# Patient Record
Sex: Female | Born: 1990 | Race: White | Hispanic: No | Marital: Single | Smoking: Current every day smoker
Health system: Southern US, Community
[De-identification: ages and names within clinical notes are randomized; demographics above are authoritative.]

## PROBLEM LIST (undated history)

## (undated) ENCOUNTER — Inpatient Hospital Stay: Payer: Self-pay

## (undated) DIAGNOSIS — F419 Anxiety disorder, unspecified: Secondary | ICD-10-CM

## (undated) DIAGNOSIS — O0930 Supervision of pregnancy with insufficient antenatal care, unspecified trimester: Secondary | ICD-10-CM

---
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
## 2011-02-10 ENCOUNTER — Emergency Department: Payer: Self-pay | Admitting: Emergency Medicine

## 2012-07-09 ENCOUNTER — Emergency Department: Payer: Self-pay | Admitting: Emergency Medicine

## 2013-09-24 ENCOUNTER — Inpatient Hospital Stay: Payer: Self-pay

## 2013-09-24 LAB — URINALYSIS, COMPLETE
Glucose,UR: NEGATIVE mg/dL (ref 0–75)
Nitrite: NEGATIVE
Ph: 6 (ref 4.5–8.0)
Protein: 30
Squamous Epithelial: 11

## 2013-09-24 LAB — CBC WITH DIFFERENTIAL/PLATELET
Basophil #: 0.1 10*3/uL (ref 0.0–0.1)
Basophil %: 0.3 %
Eosinophil #: 0 10*3/uL (ref 0.0–0.7)
HCT: 36.5 % (ref 35.0–47.0)
Lymphocyte %: 5.7 %
MCH: 28.3 pg (ref 26.0–34.0)
MCHC: 33.7 g/dL (ref 32.0–36.0)
Neutrophil #: 21.4 10*3/uL — ABNORMAL HIGH (ref 1.4–6.5)
RBC: 4.35 10*6/uL (ref 3.80–5.20)
RDW: 14.5 % (ref 11.5–14.5)
WBC: 24 10*3/uL — ABNORMAL HIGH (ref 3.6–11.0)

## 2013-09-24 LAB — GLUCOSE, RANDOM: Glucose: 85 mg/dL (ref 65–99)

## 2013-09-24 LAB — RAPID HIV-1/2 QL/CONFIRM: HIV-1/2,Rapid Ql: NEGATIVE

## 2013-09-25 LAB — DRUG SCREEN, URINE
Amphetamines, Ur Screen: NEGATIVE (ref ?–1000)
Benzodiazepine, Ur Scrn: NEGATIVE (ref ?–200)
Cannabinoid 50 Ng, Ur ~~LOC~~: NEGATIVE (ref ?–50)
Cocaine Metabolite,Ur ~~LOC~~: NEGATIVE (ref ?–300)
MDMA (Ecstasy)Ur Screen: NEGATIVE (ref ?–500)
Methadone, Ur Screen: NEGATIVE (ref ?–300)

## 2013-09-25 LAB — CBC WITH DIFFERENTIAL/PLATELET
Eosinophil #: 0.1 10*3/uL (ref 0.0–0.7)
Lymphocyte %: 6.6 %
MCH: 28.3 pg (ref 26.0–34.0)
MCV: 85 fL (ref 80–100)
Monocyte #: 1.1 x10 3/mm — ABNORMAL HIGH (ref 0.2–0.9)
Neutrophil %: 87.3 %
RDW: 14.8 % — ABNORMAL HIGH (ref 11.5–14.5)

## 2013-09-26 LAB — HEMATOCRIT: HCT: 27.8 % — ABNORMAL LOW (ref 35.0–47.0)

## 2013-09-28 LAB — GC/CHLAMYDIA PROBE AMP

## 2015-02-21 NOTE — Op Note (Signed)
PATIENT NAME:  Roberta Bishop, Roberta Bishop MR#:  [id number] DATE OF BIRTH:  [birth year]  DATE OF PROCEDURE:  09/25/2013  PREOPERATIVE DIAGNOSES: 1.  Intrauterine pregnancy at estimated [redacted] weeks gestational age.  2.  Labor.  3.  Arrest of descent.   POSTOPERATIVE DIAGNOSES:  1.  Intrauterine pregnancy at estimated [redacted] weeks gestational age.  2.  Labor.  3.  Arrest of descent.   PROCEDURE: Primary low transverse cesarean section via Pfannenstiel incision.   ANESTHESIA: Spinal.   SURGEON: Conard Novak, MD  ANESTHESIA: Spinal.   ESTIMATED BLOOD LOSS: 800 mL   OPERATIVE FLUIDS: 1200 mL crystalloid.   COMPLICATIONS: None.   FINDINGS: 1.  Viable female infant with a weight of 3100 grams and Apgars 8 at one minute and 9 at five minutes.  2.  Normal-appearing gravid uterus, fallopian tubes and ovaries.   SPECIMENS: None.   CONDITION AT THE END OF PROCEDURE: Stable.   PROCEDURE IN DETAIL: The patient was taken to the operating room where spinal anesthesia was administered and found to be adequate. She was placed in the dorsal supine position with a leftward tilt and prepped and draped in the usual sterile fashion. After timeout was called, a Pfannenstiel incision was made with a scalpel and carried through the various layers, until the peritoneum was identified and entered sharply. After extending the peritoneal opening a bladder flap was created and a bladder retractor placed to pull the bladder out of the operative area of interest. A low transverse hysterotomy was made with a scalpel and extended laterally with cranial and caudal tension. The fetal vertex was grasped after a push up from below and elevated to the hysterotomy and the head followed by the shoulders and the rest of the body were delivered without difficulty. The cord was clamped and cut and the infant was handed to the pediatrician. Cord blood was collected. The placenta was removed spontaneously intact with three vessel cord. The  uterus was exteriorized and cleared of all clots and debris. The hysterotomy was closed using #0 Vicryl in a running locked fashion. A second layer was used to obtain hemostasis and this was an imbricating layer. The uterus was returned to the abdomen and the abdomen was cleared of all clots and debris. The peritoneum was closed using #0 Vicryl in a running fashion.   The On-Q catheter system was placed according to the manufacturer's recommendations. They were placed approximately 4 cm cephalad to the incision line, approximately 1 cm apart straddling the midline. They were inserted to a depth of approximately the fourth mark on catheter, positioned just superficial to the rectus abdominis muscles and just deep to the rectus fascia.   The fascia was closed using #0 Maxon using 2 sutures, each starting at the apices and meeting in the midline where they were tied together. Skin was closed using a subcuticular stapler. This incisional closure was reinforced using benzoin and Steri-Strips. The On-Q catheters were affixed to the skin using Dermabond, Steri-Strips and Tegaderm. Each catheter was bolused with 5 mL of 0.5% Marcaine plain for a total of 10 mL.   The patient tolerated the procedure well. Sponge, lap, and needle counts were correct x 2. For VTE prophylaxis, the patient was wearing pneumatic compression stockings throughout the procedure. For antibiotic prophylaxis, the patient was given Ancef 2 grams prior to skin incision. She was taken to the recovery area in stable condition.    ____________________________ Conard Novak, MD sdj:cc D: 09/25/2013 17:50:34 ET T: 09/25/2013  18:03:19 ET JOB#: [id number]  cc: Conard NovakStephen D. Thelmer Legler, MD, <Dictator> Conard NovakSTEPHEN D Jaziah Kwasnik MD ELECTRONICALLY SIGNED 09/25/2013 18:47

## 2015-03-11 NOTE — H&P (Signed)
L&D Evaluation:  History Expanded:  HPI 24 yo G1 P0 presented to ED today with low abdominal pain. Pt believed LMP was in March, but she did not know that she was pregnant. She reports possibly feeling some movement in the past few months. Pt's mother states FOB incarcerated.   Maternal Varicella Unknown   Rubella Results (Maternal) unknown   Maternal T-Dap Unknown   Patient's Medical History No Chronic Illness   Patient's Surgical History none   Medications none   Allergies NKDA, Pineapple   Social History tobacco   Exam:  Vital Signs initial BP elevated   General appears uncomfortable   Mental Status clear   Chest clear   Heart no murmur/gallop/rubs   Abdomen gravid, tender with contractions   Estimated Fetal Weight Fundal Height 33 cm   Fetal Position vertex   Pelvic no external lesions, 6/100/-1   Mebranes Intact   FHT normal rate with no decels   Ucx regular   Other Pt very tense, unable to do internal speculum exam   Impression:  Impression IUP of unknown gestation, no PNC, labor   Plan:  Plan antibiotics for GBBS prophylaxis   Comments Admission with all OB labs Social Work consult Dating US at bedside  Addendum: Per US report, EGA 36-38 weeks, cephalic, subjectively low amniotic fluid   Electronic Signatures: Vella KohlerBrothers, Lema Heinkel K (CNM)  (Signed 24-Nov-14 23:19)  Authored: L&D Evaluation   Last Updated: 24-Nov-14 23:19 by Vella KohlerBrothers, Jerrine Urschel K (CNM)

## 2016-02-11 ENCOUNTER — Emergency Department: Payer: Self-pay

## 2016-02-11 ENCOUNTER — Emergency Department
Admission: EM | Admit: 2016-02-11 | Discharge: 2016-02-11 | Disposition: A | Discharge status: Home / Self Care | Payer: Self-pay | Attending: Emergency Medicine | Admitting: Emergency Medicine

## 2016-02-11 ENCOUNTER — Encounter: Payer: Self-pay | Admitting: Emergency Medicine

## 2016-02-11 DIAGNOSIS — O2342 Unspecified infection of urinary tract in pregnancy, second trimester: Secondary | ICD-10-CM | POA: Insufficient documentation

## 2016-02-11 DIAGNOSIS — O26899 Other specified pregnancy related conditions, unspecified trimester: Secondary | ICD-10-CM

## 2016-02-11 DIAGNOSIS — R102 Pelvic and perineal pain: Secondary | ICD-10-CM

## 2016-02-11 DIAGNOSIS — N390 Urinary tract infection, site not specified: Secondary | ICD-10-CM

## 2016-02-11 DIAGNOSIS — F17210 Nicotine dependence, cigarettes, uncomplicated: Secondary | ICD-10-CM | POA: Insufficient documentation

## 2016-02-11 LAB — URINALYSIS COMPLETE WITH MICROSCOPIC (ARMC ONLY)
BILIRUBIN URINE: NEGATIVE
Glucose, UA: NEGATIVE mg/dL
Ketones, ur: NEGATIVE mg/dL
Nitrite: POSITIVE — AB
PH: 5 (ref 5.0–8.0)
PROTEIN: NEGATIVE mg/dL
Specific Gravity, Urine: 1.026 (ref 1.005–1.030)

## 2016-02-11 LAB — CBC
HEMATOCRIT: 37.2 % (ref 35.0–47.0)
Hemoglobin: 12.9 g/dL (ref 12.0–16.0)
MCH: 30.8 pg (ref 26.0–34.0)
MCHC: 34.6 g/dL (ref 32.0–36.0)
MCV: 89.1 fL (ref 80.0–100.0)
Platelets: 304 10*3/uL (ref 150–440)
RBC: 4.17 MIL/uL (ref 3.80–5.20)
RDW: 13 % (ref 11.5–14.5)
WBC: 10 10*3/uL (ref 3.6–11.0)

## 2016-02-11 LAB — COMPREHENSIVE METABOLIC PANEL
ALT: 20 U/L (ref 14–54)
ANION GAP: 7 (ref 5–15)
AST: 23 U/L (ref 15–41)
Albumin: 3.9 g/dL (ref 3.5–5.0)
Alkaline Phosphatase: 65 U/L (ref 38–126)
BILIRUBIN TOTAL: 0.5 mg/dL (ref 0.3–1.2)
BUN: 9 mg/dL (ref 6–20)
CO2: 18 mmol/L — ABNORMAL LOW (ref 22–32)
Calcium: 8.6 mg/dL — ABNORMAL LOW (ref 8.9–10.3)
Chloride: 109 mmol/L (ref 101–111)
Creatinine, Ser: 0.67 mg/dL (ref 0.44–1.00)
GFR calc Af Amer: >60 mL/min (ref >60)
Glucose, Bld: 115 mg/dL — ABNORMAL HIGH (ref 65–99)
POTASSIUM: 3.6 mmol/L (ref 3.5–5.1)
Sodium: 134 mmol/L — ABNORMAL LOW (ref 135–145)
TOTAL PROTEIN: 7.3 g/dL (ref 6.5–8.1)

## 2016-02-11 LAB — HCG, QUANTITATIVE, PREGNANCY: HCG, BETA CHAIN, QUANT, S: 125 m[IU]/mL — AB (ref <5)

## 2016-02-11 LAB — ABO/RH: ABO/RH(D): O POS

## 2016-02-11 LAB — POCT PREGNANCY, URINE: Preg Test, Ur: POSITIVE — AB

## 2016-02-11 MED ORDER — CEPHALEXIN 500 MG PO CAPS
500.0000 mg | ORAL_CAPSULE | Freq: Once | ORAL | Status: AC
  Administered 2016-02-11: 500 mg via ORAL
  Filled 2016-02-11: qty 1

## 2016-02-11 MED ORDER — CEPHALEXIN 500 MG PO CAPS: 500.0000 mg | ORAL_CAPSULE | Freq: Four times a day (QID) | ORAL | Status: AC

## 2016-02-11 NOTE — Discharge Instructions (Signed)
You are pregnant. Do not smoke or drink alcohol or take recreational drugs. Take a prenatal vitamin every day. It is too early for us to tell exactly why you have this cramping. It is too early for us to tell whether you have an ectopic or a normal pregnancy. For this reason, you have to follow up in 48 hours to have a recheck of your blood work. If you have in the meantime increased pain, vaginal bleeding or you feel worse in any way including lightheaded return to the emergency department. You have refused a pelvic exam, which is your choice, but it does limit our ability to make sure you are not in some danger from a possible complicated pregnancy.  If you feel worse it is vital that you return to the emergency room. Do not fail to follow up with OB in 2 days.

## 2016-02-11 NOTE — ED Provider Notes (Addendum)
-----------------------------------------   9:44 AM on 02/11/2016 -----------------------------------------  Patient was signed out to me this morning with early pregnancy and cramping. Has not had any vaginal bleeding is not having pain at this time. Patient has a history of irregular menses and is unsure when her last menstrual period was 3 she states she had 2 menstrual periods last month which is not unusual for her. The patient declines pelvic exam for me stating that she has a court date and does not want to stay. She understands that this limits my ability to evaluate her. She denies dysuria or urinary frequency. Thus far, we found a very early Quant ultrasound shows likely cysts on her left adnexa. We have however paged OB to discuss the findings and should they do not wish anything more acute for this patient. Patient does have a urinary tract infection which were chewed with Keflex and we will reassess. She is quite well-appearing using her telephone in no acute distress with a benign abdomen at this time  ----------------------------------------- 9:57 AM on 02/11/2016 -----------------------------------------  I have again talked to her about pelvic exam and how will help me evaluate her for Possible Ectopic Ave., I have explained the risks of ectopic she states it pelvic exams "freaked her out" and she adamantly refuses. Nurse chaparone was present and would have been present for exam Obviously this is her choice and she does understand the risks of not doing it. We will discuss with OB with the information we have and patient is eager to leave because she has a court date.  ----------------------------------------- 10:05 AM on 02/11/2016 -----------------------------------------  Discussed with Dr. Dalbert GarnetBeasley, appreciated the call, we do agree that there is some concern about this ultrasound. No intervention needed at this time. Patient is not actively bleeding so we did not get an Rh  initially but I will add that onto her blood work so that she hasn't for follow-up. Patient does not require program at this time as is no indication for it. Patient continues to decline pelvic exam. Dr. Dalbert GarnetBeasley will follow-up with patient in 48 hours for repeat Quant and further assessment. Extensive return precautions given for pain or bleeding or lightheadedness included in my customary discussion. Patient voices understanding of all of this. She is adamant that her pain feels like cramping from a slight period. I have impressed upon her that there is some concern about possible early ectopic pregnancy though. Patient does understand the absolute need for follow-up. She also understands a urine culture is being sent, and that she will be contacted if we need to change the antibiotics.  Jeanmarie PlantJames A Donata Reddick, MD 02/11/16 [id number]  Jeanmarie PlantJames A Eual Lindstrom, MD 02/11/16 [id number]  Jeanmarie PlantJames A Micki Cassel, MD 02/11/16 [id number]  Jeanmarie PlantJames A Abisola Carrero, MD 02/11/16 1012

## 2016-02-11 NOTE — ED Provider Notes (Signed)
[facility] Emergency Department Provider Note  ____________________________________________  Time seen: 6:45 AM  I have reviewed the triage vital signs and the nursing notes.   HISTORY  Chief Complaint Abdominal Pain     HPI Roberta Bishop is a 25 y.o. female G1 P1 presents with pelvic discomfort 2 days accompanied by nausea. Patient states that she had a positive home pregnancy test performed this week however she's concerned that this might be a false positive. Patient states last menstrual period was "late last month".   Past medical history 1 previous successful pregnancy There are no active problems to display for this patient.   Past Surgical History  Procedure Laterality Date  . Cesarean section      No current outpatient prescriptions on file.  Allergies No known drug allergies No family history on file.  Social History Social History  Substance Use Topics  . Smoking status: Current Every Day Smoker -- 0.50 packs/day    Types: Cigarettes  . Smokeless tobacco: None  . Alcohol Use: No    Review of Systems  Constitutional: Negative for fever. Eyes: Negative for visual changes. ENT: Negative for sore throat. Cardiovascular: Negative for chest pain. Respiratory: Negative for shortness of breath. Gastrointestinal: Negative for abdominal pain, vomiting and diarrhea.Positive for pelvic pain Genitourinary: Negative for dysuria. Musculoskeletal: Negative for back pain. Skin: Negative for rash. Neurological: Negative for headaches, focal weakness or numbness.   10-point ROS otherwise negative.  ____________________________________________   PHYSICAL EXAM:  VITAL SIGNS: ED Triage Vitals  Enc Vitals Group     BP 02/11/16 0633 115/82 mmHg     Pulse Rate 02/11/16 0633 98     Resp 02/11/16 0633 18     Temp 02/11/16 0633 98.2 F (36.8 C)     Temp Source 02/11/16 0633 Oral     SpO2 02/11/16 0633 99 %     Weight --    Height --      Head Cir --      Peak Flow --      Pain Score 02/11/16 0628 5     Pain Loc --      Pain Edu? --      Excl. in GC? --      Constitutional: Alert and oriented. Well appearing and in no distress. Eyes: Conjunctivae are normal. PERRL. Normal extraocular movements. ENT   Head: Normocephalic and atraumatic.   Nose: No congestion/rhinnorhea.   Mouth/Throat: Mucous membranes are moist.   Neck: No stridor. Hematological/Lymphatic/Immunilogical: No cervical lymphadenopathy. Cardiovascular: Normal rate, regular rhythm. Normal and symmetric distal pulses are present in all extremities. No murmurs, rubs, or gallops. Respiratory: Normal respiratory effort without tachypnea nor retractions. Breath sounds are clear and equal bilaterally. No wheezes/rales/rhonchi. Gastrointestinal: Soft and nontender. No distention. There is no CVA tenderness. Genitourinary: deferred Musculoskeletal: Nontender with normal range of motion in all extremities. No joint effusions.  No lower extremity tenderness nor edema. Neurologic:  Normal speech and language. No gross focal neurologic deficits are appreciated. Speech is normal.  Skin:  Skin is warm, dry and intact. No rash noted. Psychiatric: Mood and affect are normal. Speech and behavior are normal. Patient exhibits appropriate insight and judgment.  ____________________________________________    LABS (pertinent positives/negatives)  Labs Reviewed  COMPREHENSIVE METABOLIC PANEL - Abnormal; Notable for the following:    Sodium 134 (*)    CO2 18 (*)    Glucose, Bld 115 (*)    Calcium 8.6 (*)    All other components  within normal limits  HCG, QUANTITATIVE, PREGNANCY - Abnormal; Notable for the following:    hCG, Beta Chain, Quant, S 125 (*)    All other components within normal limits  URINALYSIS COMPLETEWITH MICROSCOPIC ([facility] ONLY) - Abnormal; Notable for the following:    Color, Urine YELLOW (*)    APPearance HAZY (*)    Hgb  urine dipstick 1+ (*)    Nitrite POSITIVE (*)    Leukocytes, UA 3+ (*)    Bacteria, UA FEW (*)    Squamous Epithelial / LPF 6-30 (*)    All other components within normal limits  POCT PREGNANCY, URINE - Abnormal; Notable for the following:    Preg Test, Ur POSITIVE (*)    All other components within normal limits  URINE CULTURE  CBC  POC URINE PREG, ED  ABO/RH       RADIOLOGY  US OB Comp Less 14 Wks (Final result) Result time: 02/11/16 09:33:27   Final result by Rad Results In Interface (02/11/16 09:33:27)   Narrative:   CLINICAL DATA: Two days of diffuse pelvic pain quantitative beta HCG 125. Gestational age  EXAM: OBSTETRIC <14 WK [place] AND TRANSVAGINAL OB US  TECHNIQUE: Both transabdominal and transvaginal ultrasound examinations were performed for complete evaluation of the gestation as well as the maternal uterus, adnexal regions, and pelvic cul-de-sac. Transvaginal technique was performed to assess early pregnancy.  COMPARISON: None.  FINDINGS: Intrauterine gestational sac: None visualized  Yolk sac: None visualized  Embryo: None visualized  Cardiac Activity: None visualized  Heart Rate: n/a bpm  Maternal uterus/adnexae: The uterus appears empty. The endometrial stripe measures 1.6 cm maximally. There is a 1.2 x 1.2 x 1.4 cm diameter solid-appearing structure in the right ovary. There is increased vascularity surrounding it. There is a complex cystic structure in the left ovary measuring 1.3 x 1.3 x 1.2 cm. There is some associated vascularity but there is a small amount of free pelvic fluid. No "Ring of fire" is observed.  IMPRESSION: 1. No IUP is observed. 2. Solid structure in the right ovary with increased vascularity peripherally. Complex cystic structure in the left ovary without increased surrounding vascularity. Small amount of free pelvic fluid. 3. Follow-up beta HCG determination as well as follow-up ultrasound examinations are  recommended. The lack of a visible IUP does not exclude very early IUP. The indeterminate adnexal findings bilaterally are not diagnostic of ectopic pregnancy or tubo-ovarian abscess.   Electronically Signed By: David [place] M.D. On: 02/11/2016 09:33          US Ob Transvaginal (Final result) Result time: 02/11/16 09:33:27   Final result by Rad Results In Interface (02/11/16 09:33:27)   Narrative:   CLINICAL DATA: Two days of diffuse pelvic pain quantitative beta HCG 125. Gestational age  EXAM: OBSTETRIC <14 WK [place] AND TRANSVAGINAL OB US  TECHNIQUE: Both transabdominal and transvaginal ultrasound examinations were performed for complete evaluation of the gestation as well as the maternal uterus, adnexal regions, and pelvic cul-de-sac. Transvaginal technique was performed to assess early pregnancy.  COMPARISON: None.  FINDINGS: Intrauterine gestational sac: None visualized  Yolk sac: None visualized  Embryo: None visualized  Cardiac Activity: None visualized  Heart Rate: n/a bpm  Maternal uterus/adnexae: The uterus appears empty. The endometrial stripe measures 1.6 cm maximally. There is a 1.2 x 1.2 x 1.4 cm diameter solid-appearing structure in the right ovary. There is increased vascularity surrounding it. There is a complex cystic structure in the left ovary measuring 1.3 x 1.3 x  1.2 cm. There is some associated vascularity but there is a small amount of free pelvic fluid. No "Ring of fire" is observed.  IMPRESSION: 1. No IUP is observed. 2. Solid structure in the right ovary with increased vascularity peripherally. Complex cystic structure in the left ovary without increased surrounding vascularity. Small amount of free pelvic fluid. 3. Follow-up beta HCG determination as well as follow-up ultrasound examinations are recommended. The lack of a visible IUP does not exclude very early IUP. The indeterminate adnexal findings bilaterally are not  diagnostic of ectopic pregnancy or tubo-ovarian abscess.   Electronically Signed By: David SwazilandJordan M.D. On: 02/11/2016 09:33           INITIAL IMPRESSION / ASSESSMENT AND PLAN / ED COURSE  Pertinent labs & imaging results that were available during my care of the patient were reviewed by me and considered in my medical decision making (see chart for details).    ____________________________________________   FINAL CLINICAL IMPRESSION(S) / ED DIAGNOSES  Final diagnoses:  Pelvic pain in pregnancy  UTI (lower urinary tract infection)      Darci Currentandolph N Maksymilian Mabey, MD 02/12/16 [id number]

## 2016-02-11 NOTE — ED Notes (Signed)
Patient ambulatory to triage with steady gait, without difficulty or distress noted; pt reports abd pain x 2 days accomp by nausea; st positive pregnancy test at home

## 2016-02-14 LAB — URINE CULTURE: Culture: >=100000 — AB

## 2016-03-25 ENCOUNTER — Emergency Department: Payer: Self-pay

## 2016-03-25 ENCOUNTER — Emergency Department
Admission: EM | Admit: 2016-03-25 | Discharge: 2016-03-25 | Disposition: A | Discharge status: Home / Self Care | Payer: Self-pay | Attending: Student | Admitting: Student

## 2016-03-25 DIAGNOSIS — Y939 Activity, unspecified: Secondary | ICD-10-CM | POA: Insufficient documentation

## 2016-03-25 DIAGNOSIS — W010XXA Fall on same level from slipping, tripping and stumbling without subsequent striking against object, initial encounter: Secondary | ICD-10-CM | POA: Insufficient documentation

## 2016-03-25 DIAGNOSIS — R109 Unspecified abdominal pain: Secondary | ICD-10-CM

## 2016-03-25 DIAGNOSIS — O26899 Other specified pregnancy related conditions, unspecified trimester: Secondary | ICD-10-CM

## 2016-03-25 DIAGNOSIS — W19XXXA Unspecified fall, initial encounter: Secondary | ICD-10-CM

## 2016-03-25 DIAGNOSIS — Z3A10 10 weeks gestation of pregnancy: Secondary | ICD-10-CM | POA: Insufficient documentation

## 2016-03-25 DIAGNOSIS — Y929 Unspecified place or not applicable: Secondary | ICD-10-CM | POA: Insufficient documentation

## 2016-03-25 DIAGNOSIS — F17200 Nicotine dependence, unspecified, uncomplicated: Secondary | ICD-10-CM | POA: Insufficient documentation

## 2016-03-25 DIAGNOSIS — O26891 Other specified pregnancy related conditions, first trimester: Secondary | ICD-10-CM | POA: Insufficient documentation

## 2016-03-25 DIAGNOSIS — Y999 Unspecified external cause status: Secondary | ICD-10-CM | POA: Insufficient documentation

## 2016-03-25 LAB — CBC WITH DIFFERENTIAL/PLATELET
Basophils Absolute: 0.1 10*3/uL (ref 0–0.1)
Basophils Relative: 1 %
EOS ABS: 0.1 10*3/uL (ref 0–0.7)
Eosinophils Relative: 1 %
HCT: 37.9 % (ref 35.0–47.0)
HEMOGLOBIN: 12.6 g/dL (ref 12.0–16.0)
LYMPHS ABS: 2.4 10*3/uL (ref 1.0–3.6)
Lymphocytes Relative: 18 %
MCH: 30.5 pg (ref 26.0–34.0)
MCHC: 33.3 g/dL (ref 32.0–36.0)
MCV: 91.6 fL (ref 80.0–100.0)
Monocytes Absolute: 0.7 10*3/uL (ref 0.2–0.9)
Monocytes Relative: 5 %
Neutro Abs: 10.3 10*3/uL — ABNORMAL HIGH (ref 1.4–6.5)
Platelets: 229 10*3/uL (ref 150–440)
RBC: 4.14 MIL/uL (ref 3.80–5.20)
RDW: 13 % (ref 11.5–14.5)
WBC: 13.6 10*3/uL — AB (ref 3.6–11.0)

## 2016-03-25 LAB — COMPREHENSIVE METABOLIC PANEL
ALBUMIN: 3.9 g/dL (ref 3.5–5.0)
ALK PHOS: 66 U/L (ref 38–126)
ALT: 14 U/L (ref 14–54)
AST: 22 U/L (ref 15–41)
Anion gap: 10 (ref 5–15)
BUN: 9 mg/dL (ref 6–20)
CALCIUM: 9.2 mg/dL (ref 8.9–10.3)
CO2: 20 mmol/L — AB (ref 22–32)
CREATININE: 0.55 mg/dL (ref 0.44–1.00)
Chloride: 105 mmol/L (ref 101–111)
GFR calc non Af Amer: >60 mL/min (ref >60)
GLUCOSE: 103 mg/dL — AB (ref 65–99)
Potassium: 3.7 mmol/L (ref 3.5–5.1)
SODIUM: 135 mmol/L (ref 135–145)
Total Bilirubin: 0.7 mg/dL (ref 0.3–1.2)
Total Protein: 7.7 g/dL (ref 6.5–8.1)

## 2016-03-25 LAB — URINALYSIS COMPLETE WITH MICROSCOPIC (ARMC ONLY)
Bilirubin Urine: NEGATIVE
Glucose, UA: NEGATIVE mg/dL
Ketones, ur: NEGATIVE mg/dL
Nitrite: NEGATIVE
PROTEIN: 30 mg/dL — AB
Specific Gravity, Urine: 1.023 (ref 1.005–1.030)
pH: 5 (ref 5.0–8.0)

## 2016-03-25 LAB — PREGNANCY, URINE: PREG TEST UR: POSITIVE — AB

## 2016-03-25 LAB — HCG, QUANTITATIVE, PREGNANCY: hCG, Beta Chain, Quant, S: 86400 m[IU]/mL — ABNORMAL HIGH (ref <5)

## 2016-03-25 NOTE — ED Notes (Signed)
Pt fell on her Right side  today while chasing dog.  Patient thinks she is about [redacted] weeks pregnant based on previous visit to MD office.  Pt states she has pain and bruise on R hip and slight discomfort to R side/lower abdomen.  Pt seems to be in no distress and "just wants to be checked out".

## 2016-03-25 NOTE — ED Provider Notes (Signed)
[facility] Emergency Department Provider Note   ____________________________________________  Time seen: Approximately 3:32 PM  I have reviewed the triage vital signs and the nursing notes.   HISTORY  Chief Complaint Fall    HPI Roberta Bishop is a 25 y.o. female G2 P1 at approximately 9 weeks estimated gestational age who presents for evaluation of traumatic right hip/lower abdominal pain today, sudden onset, constant, mild to moderate and worse with movement. The patient was walking her dog when the dog started running, causing her to slip on concrete and fall onto her right side. No head injury or loss of consciousness. She has been ambulatory. She is predominantly concerned about her baby and just "wants to get checked out to make sure everything is okay". She was seen here on 02/11/2016 for right sided abdominal pain in early pregnancy. Her hCG was 125, ultrasound showed right-sided ovarian cyst, no IUP, the differential was concerning for pregnancy of undetermined location, the possibility of ectopic versus right-sided ovarian cyst as the cause of her pain. She was supposed to follow up with OB/GYN for recheck and repeat beta hCG however she never did. She reports that since April 12 and now,  she was not having any other right-sided abdominal pain until she fell today. No abnormal vaginal bleeding or vaginal discharge. No vomiting or diarrhea, no fevers or chills.   No past medical history on file.  There are no active problems to display for this patient.   Past Surgical History  Procedure Laterality Date  . Cesarean section      No current outpatient prescriptions on file.  Allergies Morphine and related  No family history on file.  Social History Social History  Substance Use Topics  . Smoking status: Current Every Day Smoker -- 0.50 packs/day    Types: Cigarettes  . Smokeless tobacco: Not on file  . Alcohol Use: No    Review of  Systems Constitutional: No fever/chills Eyes: No visual changes. ENT: No sore throat. Cardiovascular: Denies chest pain. Respiratory: Denies shortness of breath. Gastrointestinal: + abdominal pain.  No nausea, no vomiting.  No diarrhea.  No constipation. Genitourinary: Negative for dysuria. Musculoskeletal: Negative for back pain. Skin: Negative for rash. Neurological: Negative for headaches, focal weakness or numbness.  10-point ROS otherwise negative.  ____________________________________________   PHYSICAL EXAM:  VITAL SIGNS: ED Triage Vitals  Enc Vitals Group     BP 03/25/16 1441 121/70 mmHg     Pulse Rate 03/25/16 1441 84     Resp 03/25/16 1441 20     Temp 03/25/16 1441 97.9 F (36.6 C)     Temp Source 03/25/16 1441 Oral     SpO2 03/25/16 1441 99 %     Weight 03/25/16 1441 170 lb (77.111 kg)     Height 03/25/16 1441 5\' 2"  (1.575 m)     Head Cir --      Peak Flow --      Pain Score 03/25/16 1448 3     Pain Loc --      Pain Edu? --      Excl. in GC? --     Constitutional: Alert and oriented. Well appearing and in no acute distress.Sitting up in bed eating Funions. Eyes: Conjunctivae are normal. PERRL. EOMI. Head: Atraumatic. Nose: No congestion/rhinnorhea. Mouth/Throat: Mucous membranes are moist.  Oropharynx non-erythematous. Neck: No stridor.  No cervical spine tenderness to palpation. Cardiovascular: Normal rate, regular rhythm. Grossly normal heart sounds.  Good peripheral circulation. Respiratory:  Normal respiratory effort.  No retractions. Lungs CTAB. Gastrointestinal: Soft with mild to moderate tenderness in the right lower abdomen. No rebound or guarding. No CVA tenderness. Genitourinary: Deferred Musculoskeletal: No lower extremity tenderness nor edema.  No joint effusions. Mild tenderness to palpation over the right anterior superior iliac spine but pelvis is stable to rock and compression, full active range of motion at the hip joints  bilaterally. Neurologic:  Normal speech and language. No gross focal neurologic deficits are appreciated. No gait instability. Skin:  Skin is warm, dry and intact. No rash noted. Psychiatric: Mood and affect are normal. Speech and behavior are normal.  ____________________________________________   LABS (all labs ordered are listed, but only abnormal results are displayed)  Labs Reviewed  CBC WITH DIFFERENTIAL/PLATELET - Abnormal; Notable for the following:    WBC 13.6 (*)    Neutro Abs 10.3 (*)    All other components within normal limits  COMPREHENSIVE METABOLIC PANEL - Abnormal; Notable for the following:    CO2 20 (*)    Glucose, Bld 103 (*)    All other components within normal limits  HCG, QUANTITATIVE, PREGNANCY - Abnormal; Notable for the following:    hCG, Beta Chain, Quant, S [id number] (*)    All other components within normal limits  URINALYSIS COMPLETEWITH MICROSCOPIC ([facility] ONLY) - Abnormal; Notable for the following:    Color, Urine YELLOW (*)    APPearance CLOUDY (*)    Hgb urine dipstick 1+ (*)    Protein, ur 30 (*)    Leukocytes, UA 3+ (*)    Bacteria, UA FEW (*)    Squamous Epithelial / LPF 6-30 (*)    All other components within normal limits  PREGNANCY, URINE - Abnormal; Notable for the following:    Preg Test, Ur POSITIVE (*)    All other components within normal limits  URINE CULTURE   ____________________________________________  EKG  none ____________________________________________  RADIOLOGY  Transvaginal ultrasound IMPRESSION: Single live intrauterine pregnancy corresponding to 10 week and 1 day gestation. ____________________________________________   PROCEDURES  Procedure(s) performed: None  Critical Care performed: No  ____________________________________________   INITIAL IMPRESSION / ASSESSMENT AND PLAN / ED COURSE  Pertinent labs & imaging results that were available during my care of the patient were reviewed by me and  considered in my medical decision making (see chart for details).  Roberta Bishop is a 25 y.o. female G2 P1 at approximately 9 weeks estimated gestational age who presents for evaluation of traumatic right hip/lower abdominal pain today after she had a mechanical fall and fell onto the right abdomen/hip. On exam, she is very well-appearing and in no acute distress. Her vital signs are stable, she is afebrile. She is sitting up in bed eating a bag of chips. She does have some tenderness to palpation throughout the right abdomen and given her history of pregnancy of undetermined location with no follow-up, we'll obtain screening labs, beta hCG as well as ultrasound to rule out ectopic. She has minimal tenderness over the right ASIS and I suspect that this represents contusion, no indication for plain films as I doubt fracture or dislocation. She ambulates well.  ----------------------------------------- 5:43 PM on 03/25/2016 ----------------------------------------- Patient is resting comfortably, texting on her Smart phone. Labs are notable for mild leukocytosis which is nonspecific. Unremarkable CMP. HCG was elevated at approximately 86,000 and transvaginal ultrasound shows a single live intrauterine pregnancy at 10 weeks and 1 day estimated gestational age. Urinalysis is likely contaminated with multiple squamous cells  and the patient has no urinary complaints therefore will send culture and wait to treat. We discussed return precautions, need for close OB/GYN follow-up and she is comfortable with the discharge plan. DC home. ____________________________________________   FINAL CLINICAL IMPRESSION(S) / ED DIAGNOSES  Final diagnoses:  Fall, initial encounter  Abdominal pain in pregnancy      NEW MEDICATIONS STARTED DURING THIS VISIT:  New Prescriptions   No medications on file     Note:  This document was prepared using Dragon voice recognition software and may include unintentional  dictation errors.    Gayla Doss, MD 03/25/16 [phone]

## 2016-03-26 LAB — URINE CULTURE

## 2016-06-10 ENCOUNTER — Observation Stay
Admission: EM | Admit: 2016-06-10 | Discharge: 2016-06-11 | Disposition: A | Discharge status: Home / Self Care | Payer: Self-pay | Attending: Obstetrics & Gynecology | Admitting: Obstetrics & Gynecology

## 2016-06-10 DIAGNOSIS — Z3A21 21 weeks gestation of pregnancy: Secondary | ICD-10-CM | POA: Insufficient documentation

## 2016-06-10 DIAGNOSIS — M79604 Pain in right leg: Secondary | ICD-10-CM | POA: Insufficient documentation

## 2016-06-10 DIAGNOSIS — O26892 Other specified pregnancy related conditions, second trimester: Principal | ICD-10-CM | POA: Insufficient documentation

## 2016-06-10 DIAGNOSIS — M79605 Pain in left leg: Secondary | ICD-10-CM | POA: Insufficient documentation

## 2016-06-10 DIAGNOSIS — M549 Dorsalgia, unspecified: Secondary | ICD-10-CM | POA: Insufficient documentation

## 2016-06-10 DIAGNOSIS — R102 Pelvic and perineal pain: Secondary | ICD-10-CM | POA: Diagnosis present

## 2016-06-10 DIAGNOSIS — R109 Unspecified abdominal pain: Secondary | ICD-10-CM | POA: Insufficient documentation

## 2016-06-10 LAB — URINALYSIS COMPLETE WITH MICROSCOPIC (ARMC ONLY)
Bilirubin Urine: NEGATIVE
Glucose, UA: NEGATIVE mg/dL
Ketones, ur: NEGATIVE mg/dL
Nitrite: NEGATIVE
PH: 6 (ref 5.0–8.0)
Protein, ur: 30 mg/dL — AB
Specific Gravity, Urine: 1.026 (ref 1.005–1.030)

## 2016-06-10 MED ORDER — ONDANSETRON HCL 4 MG/2ML IJ SOLN: 4.0000 mg | Freq: Four times a day (QID) | INTRAMUSCULAR | Status: DC | PRN

## 2016-06-10 MED ORDER — ACETAMINOPHEN 325 MG PO TABS: 650.0000 mg | ORAL_TABLET | ORAL | Status: DC | PRN

## 2016-06-11 MED ORDER — NITROFURANTOIN MONOHYD MACRO 100 MG PO CAPS
ORAL_CAPSULE | ORAL | Status: AC
  Administered 2016-06-11: 100 mg via ORAL
  Filled 2016-06-11: qty 1

## 2016-06-11 MED ORDER — NITROFURANTOIN MONOHYD MACRO 100 MG PO CAPS
100.0000 mg | ORAL_CAPSULE | Freq: Two times a day (BID) | ORAL | Status: DC
  Administered 2016-06-11: 100 mg via ORAL

## 2016-06-11 MED ORDER — NITROFURANTOIN MONOHYD MACRO 100 MG PO CAPS: 100.0000 mg | ORAL_CAPSULE | Freq: Two times a day (BID) | ORAL | 0 refills | Status: DC

## 2016-06-11 NOTE — Discharge Summary (Signed)
  See FPN 

## 2016-06-11 NOTE — Final Progress Note (Signed)
Physician Final Progress Note  Patient ID: Roberta Bishop MRN: [id number] DOB/AGE: [birth year] 25 y.o.  Admit date: 06/10/2016 Admitting provider: Nadara Mustardobert P Delon Revelo, MD Discharge date: 06/11/2016   Admission Diagnoses: Pain in abd and back, [redacted] weeks pregnant  Discharge Diagnoses:  Active Problems:   Female pelvic pain    UTI  Consults: None  Significant Findings/ Diagnostic Studies: labs: c/w UTI  Procedures: FHTs 140s  Discharge Condition: good  Hospitalization: Pt is a G2P1 at 21 weeks w c/o 3 hour h/o pain in lower abd as well as back and legs, no dysuria or hematuria or VB.  [facility]  Exam stable although pt refused vaginal exam.  UA c/w UTI as are sx's.  Cannot rule out cervical problems, incompetence, or PTL without doing exam but suspicion is low.  Disposition: 01-Home or Self Care  Diet: Regular diet  Discharge Activity: Activity as tolerated     Medication List    TAKE these medications   nitrofurantoin (macrocrystal-monohydrate) 100 MG capsule Commonly known as:  MACROBID Take 1 capsule (100 mg total) by mouth 2 (two) times daily.        Total time spent taking care of this patient: 30 minutes  Signed: Letitia Libraobert Paul Willona Bishop 06/11/2016, 12:08 AM

## 2016-06-11 NOTE — OB Triage Note (Signed)
Patient came in for observation for lower abdominal and back pain. Patient denies contractions, leaking of fluid or vaginal bleeding. Vital signs stable and patient afebrile. Doppler 135. Will continue to monitor.

## 2016-06-11 NOTE — Discharge Summary (Signed)
Patient discharged with instructions on new prescription, follow up appointment, abdominal pain during pregnancy, and when to seek medical attention. Patient ambulatory at discharge with steady gait. Patient discharged by herself.

## 2016-09-13 ENCOUNTER — Observation Stay
Admission: EM | Admit: 2016-09-13 | Discharge: 2016-09-13 | Disposition: A | Discharge status: Home / Self Care | Payer: Self-pay | Attending: Obstetrics & Gynecology | Admitting: Obstetrics & Gynecology

## 2016-09-13 DIAGNOSIS — Z87891 Personal history of nicotine dependence: Secondary | ICD-10-CM | POA: Insufficient documentation

## 2016-09-13 DIAGNOSIS — Z3A34 34 weeks gestation of pregnancy: Secondary | ICD-10-CM | POA: Insufficient documentation

## 2016-09-13 DIAGNOSIS — Z885 Allergy status to narcotic agent status: Secondary | ICD-10-CM | POA: Insufficient documentation

## 2016-09-13 DIAGNOSIS — O1203 Gestational edema, third trimester: Principal | ICD-10-CM | POA: Insufficient documentation

## 2016-09-13 LAB — URINALYSIS COMPLETE WITH MICROSCOPIC (ARMC ONLY)
BILIRUBIN URINE: NEGATIVE
Glucose, UA: NEGATIVE mg/dL
Hgb urine dipstick: NEGATIVE
KETONES UR: NEGATIVE mg/dL
Nitrite: NEGATIVE
PH: 7 (ref 5.0–8.0)
Protein, ur: NEGATIVE mg/dL
Specific Gravity, Urine: 1.012 (ref 1.005–1.030)

## 2016-09-13 NOTE — Discharge Summary (Signed)
Physician Final Progress Note  Patient ID: Roberta DingwallSamantha L Rebel MRN: [id number] DOB/AGE: Apr 28, 1991 25 y.o.  Admit date: 09/13/2016 Admitting provider: Nadara Mustardobert P Harris, MD Discharge date: 09/13/2016   Admission Diagnoses: G2P1 at 2071w5d with c/o swelling in feet. Pt admits increase in swelling/discomfort in her feet in the last few days. She admits positive fetal movement. She denies contractions, LOF, VB, HA, change of vision, epigastric pain.  Discharge Diagnoses:  Active Problems:   Indication for care in labor and delivery, antepartum IUP at 1971w5d with reactive NST, bilateral swelling in feet  Hospital Course: Pt was admitted for observation, placed on monitors, labs done: urine sent for UA due to concentrated appearance  No past medical history on file.  Past Surgical History:  Procedure Laterality Date  . CESAREAN SECTION      No current facility-administered medications on file prior to encounter.    Current Outpatient Prescriptions on File Prior to Encounter  Medication Sig Dispense Refill  . nitrofurantoin, macrocrystal-monohydrate, (MACROBID) 100 MG capsule Take 1 capsule (100 mg total) by mouth 2 (two) times daily. 10 capsule 0    Allergies  Allergen Reactions  . Morphine And Related Nausea And Vomiting    Social History   Social History  . Marital status: Single    Spouse name: N/A  . Number of children: N/A  . Years of education: N/A   Occupational History  . Not on file.   Social History Main Topics  . Smoking status: Former Smoker    Packs/day: 0.50    Types: Cigarettes    Quit date: 02/10/2015  . Smokeless tobacco: Never Used  . Alcohol use No  . Drug use: Unknown  . Sexual activity: Not on file   Other Topics Concern  . Not on file   Social History Narrative  . No narrative on file    Physical Exam: Temp 98.6 F (37 C) (Oral)   Resp 20   Ht 5\' 2"  (1.575 m)   LMP 01/25/2016 (Exact Date)   Gen: NAD CV: RRR Pulm: CTAB Pelvic:  deferred Ext: no evidence of DVT Toco: negative Fetal Well Being: 130 bpm, moderate variability, +accelerations, -decelerations  Consults: None  Significant Findings/ Diagnostic Studies: labs:   Results for Roberta DingwallHOGAN, Gavyn L (MRN [id number]) as of 09/13/2016 22:11  Ref. Range 09/13/2016 19:49  Appearance Latest Ref Range: CLEAR  HAZY (A)  Bacteria, UA Latest Ref Range: NONE SEEN  RARE (A)  Bilirubin Urine Latest Ref Range: NEGATIVE  NEGATIVE  Color, Urine Latest Ref Range: YELLOW  YELLOW (A)  Glucose Latest Ref Range: NEGATIVE mg/dL NEGATIVE  Hgb urine dipstick Latest Ref Range: NEGATIVE  NEGATIVE  Ketones, ur Latest Ref Range: NEGATIVE mg/dL NEGATIVE  Leukocytes, UA Latest Ref Range: NEGATIVE  TRACE (A)  Mucous Unknown PRESENT  Nitrite Latest Ref Range: NEGATIVE  NEGATIVE  pH Latest Ref Range: 5.0 - 8.0  7.0  Protein Latest Ref Range: NEGATIVE mg/dL NEGATIVE  RBC / HPF Latest Ref Range: 0 - 5 RBC/hpf 0-5  Specific Gravity, Urine Latest Ref Range: 1.005 - 1.030  1.012  Squamous Epithelial / LPF Latest Ref Range: NONE SEEN  0-5 (A)  WBC, UA Latest Ref Range: 0 - 5 WBC/hpf 0-5    Procedures: NST  Discharge Condition: good  Disposition: 01-Home or Self Care  Diet: Regular diet  Discharge Activity: Activity as tolerated  Discharge Instructions    Discharge activity:  No Restrictions    Complete by:  As directed  Discharge diet:  No restrictions    Complete by:  As directed    Fetal Kick Count:  Lie on our left side for one hour after a meal, and count the number of times your baby kicks.  If it is less than 5 times, get up, move around and drink some juice.  Repeat the test 30 minutes later.  If it is still less than 5 kicks in an hour, notify your doctor.    Complete by:  As directed    No sexual activity restrictions    Complete by:  As directed    Notify physician for a general feeling that "something is not right"    Complete by:  As directed    Notify physician for  increase or change in vaginal discharge    Complete by:  As directed    Notify physician for intestinal cramps, with or without diarrhea, sometimes described as "gas pain"    Complete by:  As directed    Notify physician for leaking of fluid    Complete by:  As directed    Notify physician for low, dull backache, unrelieved by heat or Tylenol    Complete by:  As directed    Notify physician for menstrual like cramps    Complete by:  As directed    Notify physician for pelvic pressure    Complete by:  As directed    Notify physician for uterine contractions.  These may be painless and feel like the uterus is tightening or the baby is  "balling up"    Complete by:  As directed    Notify physician for vaginal bleeding    Complete by:  As directed    PRETERM LABOR:  Includes any of the follwing symptoms that occur between 20 - [redacted] weeks gestation.  If these symptoms are not stopped, preterm labor can result in preterm delivery, placing your baby at risk    Complete by:  As directed        Medication List    STOP taking these medications   nitrofurantoin (macrocrystal-monohydrate) 100 MG capsule Commonly known as:  MACROBID      Follow-up Information    [facility].   Why:  go to regular scheduled prenatal appointment Contact information: [address] [place] [address] [phone]           Total time spent taking care of this patient: 20 minutes  Signed: Tresea MallGLEDHILL,Marithza Malachi, CNM  09/13/2016, 10:05 PM

## 2016-10-20 ENCOUNTER — Encounter
Admission: EM | Disposition: A | Discharge status: Other Acute Inpatient Hospital | Payer: Self-pay | Source: Home / Self Care | Attending: Obstetrics & Gynecology

## 2016-10-20 ENCOUNTER — Inpatient Hospital Stay: Payer: Medicaid Other | Admitting: Anesthesiology

## 2016-10-20 ENCOUNTER — Inpatient Hospital Stay
Admission: EM | Admit: 2016-10-20 | Discharge: 2016-10-21 | DRG: 765 | Payer: Medicaid Other | Attending: Obstetrics & Gynecology | Admitting: Obstetrics & Gynecology

## 2016-10-20 DIAGNOSIS — O34211 Maternal care for low transverse scar from previous cesarean delivery: Principal | ICD-10-CM | POA: Diagnosis present

## 2016-10-20 DIAGNOSIS — A749 Chlamydial infection, unspecified: Secondary | ICD-10-CM | POA: Diagnosis present

## 2016-10-20 DIAGNOSIS — O9822 Gonorrhea complicating childbirth: Secondary | ICD-10-CM | POA: Diagnosis present

## 2016-10-20 DIAGNOSIS — Z3A40 40 weeks gestation of pregnancy: Secondary | ICD-10-CM

## 2016-10-20 DIAGNOSIS — O34219 Maternal care for unspecified type scar from previous cesarean delivery: Secondary | ICD-10-CM | POA: Diagnosis present

## 2016-10-20 DIAGNOSIS — Z87891 Personal history of nicotine dependence: Secondary | ICD-10-CM | POA: Diagnosis not present

## 2016-10-20 DIAGNOSIS — R42 Dizziness and giddiness: Secondary | ICD-10-CM | POA: Diagnosis present

## 2016-10-20 DIAGNOSIS — O0930 Supervision of pregnancy with insufficient antenatal care, unspecified trimester: Secondary | ICD-10-CM

## 2016-10-20 DIAGNOSIS — O0993 Supervision of high risk pregnancy, unspecified, third trimester: Secondary | ICD-10-CM

## 2016-10-20 DIAGNOSIS — A568 Sexually transmitted chlamydial infection of other sites: Secondary | ICD-10-CM | POA: Diagnosis present

## 2016-10-20 DIAGNOSIS — Z3493 Encounter for supervision of normal pregnancy, unspecified, third trimester: Secondary | ICD-10-CM | POA: Diagnosis present

## 2016-10-20 DIAGNOSIS — A549 Gonococcal infection, unspecified: Secondary | ICD-10-CM | POA: Diagnosis present

## 2016-10-20 DIAGNOSIS — O9081 Anemia of the puerperium: Secondary | ICD-10-CM | POA: Diagnosis present

## 2016-10-20 DIAGNOSIS — D62 Acute posthemorrhagic anemia: Secondary | ICD-10-CM | POA: Diagnosis present

## 2016-10-20 DIAGNOSIS — O9832 Other infections with a predominantly sexual mode of transmission complicating childbirth: Secondary | ICD-10-CM | POA: Diagnosis present

## 2016-10-20 DIAGNOSIS — D509 Iron deficiency anemia, unspecified: Secondary | ICD-10-CM | POA: Diagnosis present

## 2016-10-20 HISTORY — DX: Supervision of pregnancy with insufficient antenatal care, unspecified trimester: O09.30

## 2016-10-20 LAB — CHLAMYDIA/NGC RT PCR (ARMC ONLY)
Chlamydia Tr: DETECTED — AB
N GONORRHOEAE: DETECTED — AB

## 2016-10-20 LAB — CBC
HEMATOCRIT: 30.1 % — AB (ref 35.0–47.0)
Hemoglobin: 10 g/dL — ABNORMAL LOW (ref 12.0–16.0)
MCH: 26.1 pg (ref 26.0–34.0)
MCHC: 33.4 g/dL (ref 32.0–36.0)
MCV: 78 fL — ABNORMAL LOW (ref 80.0–100.0)
Platelets: 278 10*3/uL (ref 150–440)
RBC: 3.85 MIL/uL (ref 3.80–5.20)
RDW: 15.2 % — AB (ref 11.5–14.5)
WBC: 12.9 10*3/uL — AB (ref 3.6–11.0)

## 2016-10-20 LAB — URINE DRUG SCREEN, QUALITATIVE (ARMC ONLY)
AMPHETAMINES, UR SCREEN: NOT DETECTED
BARBITURATES, UR SCREEN: NOT DETECTED
BENZODIAZEPINE, UR SCRN: NOT DETECTED
Cannabinoid 50 Ng, Ur ~~LOC~~: NOT DETECTED
Cocaine Metabolite,Ur ~~LOC~~: NOT DETECTED
MDMA (Ecstasy)Ur Screen: NOT DETECTED
METHADONE SCREEN, URINE: NOT DETECTED
Opiate, Ur Screen: NOT DETECTED
PHENCYCLIDINE (PCP) UR S: NOT DETECTED
Tricyclic, Ur Screen: NOT DETECTED

## 2016-10-20 LAB — TYPE AND SCREEN
ABO/RH(D): O POS
ANTIBODY SCREEN: NEGATIVE

## 2016-10-20 LAB — RAPID HIV SCREEN (HIV 1/2 AB+AG)
HIV 1/2 Antibodies: NONREACTIVE
HIV-1 P24 Antigen - HIV24: NONREACTIVE

## 2016-10-20 LAB — HEPATITIS B SURFACE ANTIGEN: HEP B S AG: NEGATIVE

## 2016-10-20 SURGERY — Surgical Case
Anesthesia: Spinal

## 2016-10-20 MED ORDER — MIDAZOLAM HCL 2 MG/2ML IJ SOLN
INTRAMUSCULAR | Status: AC
  Filled 2016-10-20: qty 2

## 2016-10-20 MED ORDER — AZITHROMYCIN 1 G PO PACK
1.0000 g | PACK | Freq: Once | ORAL | Status: DC
  Filled 2016-10-20: qty 1

## 2016-10-20 MED ORDER — WITCH HAZEL-GLYCERIN EX PADS: 1.0000 "application " | MEDICATED_PAD | CUTANEOUS | Status: DC | PRN

## 2016-10-20 MED ORDER — CEFTRIAXONE SODIUM-DEXTROSE 1-3.74 GM-% IV SOLR
1.0000 g | Freq: Once | INTRAVENOUS | Status: AC
  Administered 2016-10-20: 1 g via INTRAVENOUS
  Filled 2016-10-20: qty 50

## 2016-10-20 MED ORDER — PHENYLEPHRINE 40 MCG/ML (10ML) SYRINGE FOR IV PUSH (FOR BLOOD PRESSURE SUPPORT)
PREFILLED_SYRINGE | INTRAVENOUS | Status: DC | PRN
  Administered 2016-10-20 (×5): 80 ug via INTRAVENOUS

## 2016-10-20 MED ORDER — OXYCODONE-ACETAMINOPHEN 5-325 MG PO TABS: 1.0000 | ORAL_TABLET | ORAL | Status: DC | PRN

## 2016-10-20 MED ORDER — OXYTOCIN 40 UNITS IN LACTATED RINGERS INFUSION - SIMPLE MED
2.5000 [IU]/h | INTRAVENOUS | Status: AC
  Filled 2016-10-20: qty 1000

## 2016-10-20 MED ORDER — BUPIVACAINE HCL (PF) 0.5 % IJ SOLN
10.0000 mL | Freq: Once | INTRAMUSCULAR | Status: DC
Start: 2016-10-20 — End: 2016-10-20

## 2016-10-20 MED ORDER — HYDROMORPHONE HCL 1 MG/ML IJ SOLN
0.5000 mg | INTRAMUSCULAR | Status: AC | PRN
  Administered 2016-10-20 (×2): 0.5 mg via INTRAVENOUS
  Filled 2016-10-20: qty 0.5

## 2016-10-20 MED ORDER — PHENYLEPHRINE 40 MCG/ML (10ML) SYRINGE FOR IV PUSH (FOR BLOOD PRESSURE SUPPORT)
PREFILLED_SYRINGE | INTRAVENOUS | Status: AC
  Filled 2016-10-20: qty 10

## 2016-10-20 MED ORDER — PRENATAL MULTIVITAMIN CH
1.0000 | ORAL_TABLET | Freq: Every day | ORAL | Status: DC
  Administered 2016-10-20 – 2016-10-21 (×2): 1 via ORAL
  Filled 2016-10-20 (×2): qty 1

## 2016-10-20 MED ORDER — SIMETHICONE 80 MG PO CHEW
80.0000 mg | CHEWABLE_TABLET | Freq: Three times a day (TID) | ORAL | Status: DC
  Administered 2016-10-20 – 2016-10-21 (×4): 80 mg via ORAL
  Filled 2016-10-20 (×4): qty 1

## 2016-10-20 MED ORDER — FENTANYL CITRATE (PF) 100 MCG/2ML IJ SOLN: 25.0000 ug | INTRAMUSCULAR | Status: DC | PRN

## 2016-10-20 MED ORDER — ONDANSETRON HCL 4 MG/2ML IJ SOLN: 4.0000 mg | Freq: Once | INTRAMUSCULAR | Status: DC | PRN

## 2016-10-20 MED ORDER — ONDANSETRON HCL 4 MG/2ML IJ SOLN
INTRAMUSCULAR | Status: AC
  Filled 2016-10-20: qty 2

## 2016-10-20 MED ORDER — FERROUS SULFATE 325 (65 FE) MG PO TABS
325.0000 mg | ORAL_TABLET | Freq: Two times a day (BID) | ORAL | Status: DC
  Administered 2016-10-20 – 2016-10-21 (×3): 325 mg via ORAL
  Filled 2016-10-20 (×3): qty 1

## 2016-10-20 MED ORDER — LIDOCAINE HCL (PF) 1 % IJ SOLN: 30.0000 mL | INTRAMUSCULAR | Status: DC | PRN

## 2016-10-20 MED ORDER — BUPIVACAINE 0.25 % ON-Q PUMP DUAL CATH 400 ML
400.0000 mL | INJECTION | Status: DC
  Filled 2016-10-20: qty 400

## 2016-10-20 MED ORDER — DEXTROSE 5 % IV SOLN: 1.0000 g | Freq: Once | INTRAVENOUS | Status: DC

## 2016-10-20 MED ORDER — FENTANYL CITRATE (PF) 100 MCG/2ML IJ SOLN
INTRAMUSCULAR | Status: DC | PRN
  Administered 2016-10-20 (×4): 50 ug via INTRAVENOUS

## 2016-10-20 MED ORDER — ONDANSETRON HCL 4 MG/2ML IJ SOLN: 4.0000 mg | Freq: Four times a day (QID) | INTRAMUSCULAR | Status: DC | PRN

## 2016-10-20 MED ORDER — SOD CITRATE-CITRIC ACID 500-334 MG/5ML PO SOLN
30.0000 mL | ORAL | Status: AC
  Administered 2016-10-20: 30 mL via ORAL
  Filled 2016-10-20: qty 15

## 2016-10-20 MED ORDER — BUPIVACAINE IN DEXTROSE 0.75-8.25 % IT SOLN
INTRATHECAL | Status: DC | PRN
  Administered 2016-10-20: 1.6 mL via INTRATHECAL

## 2016-10-20 MED ORDER — COCONUT OIL OIL: 1.0000 "application " | TOPICAL_OIL | Status: DC | PRN

## 2016-10-20 MED ORDER — MENTHOL 3 MG MT LOZG
1.0000 | LOZENGE | OROMUCOSAL | Status: DC | PRN
  Filled 2016-10-20: qty 9

## 2016-10-20 MED ORDER — OXYTOCIN 40 UNITS IN LACTATED RINGERS INFUSION - SIMPLE MED
INTRAVENOUS | Status: AC
  Administered 2016-10-20: 2.5 [IU]/h via INTRAVENOUS
  Filled 2016-10-20: qty 1000

## 2016-10-20 MED ORDER — FENTANYL CITRATE (PF) 100 MCG/2ML IJ SOLN
INTRAMUSCULAR | Status: AC
  Filled 2016-10-20: qty 2

## 2016-10-20 MED ORDER — IBUPROFEN 600 MG PO TABS
600.0000 mg | ORAL_TABLET | Freq: Four times a day (QID) | ORAL | Status: DC
  Administered 2016-10-20 – 2016-10-21 (×6): 600 mg via ORAL
  Filled 2016-10-20 (×6): qty 1

## 2016-10-20 MED ORDER — DIPHENHYDRAMINE HCL 25 MG PO CAPS: 25.0000 mg | ORAL_CAPSULE | Freq: Four times a day (QID) | ORAL | Status: DC | PRN

## 2016-10-20 MED ORDER — HYDROMORPHONE HCL 1 MG/ML IJ SOLN
INTRAMUSCULAR | Status: AC
  Filled 2016-10-20: qty 0.5

## 2016-10-20 MED ORDER — HYDROMORPHONE HCL 1 MG/ML IJ SOLN
INTRAMUSCULAR | Status: AC
  Administered 2016-10-20: 0.5 mg via INTRAVENOUS
  Filled 2016-10-20: qty 0.5

## 2016-10-20 MED ORDER — LACTATED RINGERS IV SOLN: 500.0000 mL | INTRAVENOUS | Status: DC | PRN

## 2016-10-20 MED ORDER — ONDANSETRON HCL 4 MG/2ML IJ SOLN
INTRAMUSCULAR | Status: DC | PRN
  Administered 2016-10-20: 4 mg via INTRAVENOUS

## 2016-10-20 MED ORDER — LACTATED RINGERS IV BOLUS (SEPSIS)
500.0000 mL | Freq: Once | INTRAVENOUS | Status: AC
  Administered 2016-10-20: 500 mL via INTRAVENOUS

## 2016-10-20 MED ORDER — HYDROMORPHONE HCL 1 MG/ML IJ SOLN
1.0000 mg | INTRAMUSCULAR | Status: DC | PRN
  Administered 2016-10-20: 1 mg via INTRAVENOUS
  Filled 2016-10-20: qty 1

## 2016-10-20 MED ORDER — DEXTROSE 5 % IV SOLN: 1000.0000 mg | Freq: Once | INTRAVENOUS | Status: DC

## 2016-10-20 MED ORDER — LACTATED RINGERS IV SOLN
INTRAVENOUS | Status: DC
  Administered 2016-10-20 – 2016-10-21 (×2): via INTRAVENOUS

## 2016-10-20 MED ORDER — HYDROMORPHONE HCL 1 MG/ML IJ SOLN
0.2500 mg | INTRAMUSCULAR | Status: DC | PRN
  Administered 2016-10-20 (×2): 0.25 mg via INTRAVENOUS
  Filled 2016-10-20: qty 0.5

## 2016-10-20 MED ORDER — LACTATED RINGERS IV SOLN
INTRAVENOUS | Status: DC
  Administered 2016-10-20: 03:00:00 via INTRAVENOUS

## 2016-10-20 MED ORDER — AZITHROMYCIN 250 MG PO TABS
1000.0000 mg | ORAL_TABLET | Freq: Once | ORAL | Status: AC
  Administered 2016-10-20: 1000 mg via ORAL
  Filled 2016-10-20: qty 4

## 2016-10-20 MED ORDER — DIBUCAINE 1 % RE OINT: 1.0000 "application " | TOPICAL_OINTMENT | RECTAL | Status: DC | PRN

## 2016-10-20 MED ORDER — BUPIVACAINE HCL (PF) 0.5 % IJ SOLN
INTRAMUSCULAR | Status: AC
  Filled 2016-10-20: qty 30

## 2016-10-20 MED ORDER — FENTANYL CITRATE (PF) 100 MCG/2ML IJ SOLN
25.0000 ug | INTRAMUSCULAR | Status: DC | PRN
  Administered 2016-10-20 (×5): 25 ug via INTRAVENOUS
  Filled 2016-10-20 (×2): qty 2

## 2016-10-20 MED ORDER — CEFAZOLIN SODIUM-DEXTROSE 2-4 GM/100ML-% IV SOLN
2.0000 g | INTRAVENOUS | Status: AC
  Administered 2016-10-20: 2 g via INTRAVENOUS
  Filled 2016-10-20: qty 100

## 2016-10-20 MED ORDER — OXYTOCIN 40 UNITS IN LACTATED RINGERS INFUSION - SIMPLE MED
2.5000 [IU]/h | INTRAVENOUS | Status: DC
  Administered 2016-10-20: 2.5 [IU]/h via INTRAVENOUS
  Administered 2016-10-20: 1000 mL via INTRAVENOUS
  Filled 2016-10-20: qty 1000

## 2016-10-20 MED ORDER — SENNOSIDES-DOCUSATE SODIUM 8.6-50 MG PO TABS
2.0000 | ORAL_TABLET | ORAL | Status: DC
  Administered 2016-10-20: 2 via ORAL
  Filled 2016-10-20: qty 2

## 2016-10-20 MED ORDER — OXYTOCIN BOLUS FROM INFUSION: 500.0000 mL | Freq: Once | INTRAVENOUS | Status: DC

## 2016-10-20 MED ORDER — MIDAZOLAM HCL 2 MG/2ML IJ SOLN
INTRAMUSCULAR | Status: DC | PRN
  Administered 2016-10-20 (×2): 1 mg via INTRAVENOUS

## 2016-10-20 MED ORDER — OXYCODONE-ACETAMINOPHEN 5-325 MG PO TABS
2.0000 | ORAL_TABLET | ORAL | Status: DC | PRN
  Administered 2016-10-20 – 2016-10-21 (×6): 2 via ORAL
  Filled 2016-10-20 (×6): qty 2

## 2016-10-20 SURGICAL SUPPLY — 29 items
CANISTER SUCT 3000ML (MISCELLANEOUS) ×3 IMPLANT
CATH KIT ON-Q SILVERSOAK 5IN (CATHETERS) ×6 IMPLANT
CLOSURE WOUND 1/2 X4 (GAUZE/BANDAGES/DRESSINGS) ×1
DRSG OPSITE POSTOP 4X10 (GAUZE/BANDAGES/DRESSINGS) ×3 IMPLANT
DRSG TELFA 3X8 NADH (GAUZE/BANDAGES/DRESSINGS) IMPLANT
ELECT CAUTERY BLADE 6.4 (BLADE) ×3 IMPLANT
ELECT REM PT RETURN 9FT ADLT (ELECTROSURGICAL) ×3
ELECTRODE REM PT RTRN 9FT ADLT (ELECTROSURGICAL) ×1 IMPLANT
GAUZE SPONGE 4X4 12PLY STRL (GAUZE/BANDAGES/DRESSINGS) IMPLANT
GLOVE BIO SURGEON STRL SZ7 (GLOVE) ×9 IMPLANT
GLOVE INDICATOR 7.5 STRL GRN (GLOVE) ×9 IMPLANT
GOWN STRL REUS W/ TWL LRG LVL3 (GOWN DISPOSABLE) ×3 IMPLANT
GOWN STRL REUS W/TWL LRG LVL3 (GOWN DISPOSABLE) ×6
LIQUID BAND (GAUZE/BANDAGES/DRESSINGS) ×3 IMPLANT
NS IRRIG 1000ML POUR BTL (IV SOLUTION) ×3 IMPLANT
PACK C SECTION AR (MISCELLANEOUS) ×3 IMPLANT
PAD OB MATERNITY 4.3X12.25 (PERSONAL CARE ITEMS) ×3 IMPLANT
PAD PREP 24X41 OB/GYN DISP (PERSONAL CARE ITEMS) ×3 IMPLANT
SPONGE LAP 18X18 5 PK (GAUZE/BANDAGES/DRESSINGS) ×6 IMPLANT
STRIP CLOSURE SKIN 1/2X4 (GAUZE/BANDAGES/DRESSINGS) ×2 IMPLANT
SUT CHROMIC GUT BROWN 0 54 (SUTURE) ×1 IMPLANT
SUT CHROMIC GUT BROWN 0 54IN (SUTURE) ×3
SUT MNCRL 4-0 (SUTURE) ×2
SUT MNCRL 4-0 27XMFL (SUTURE) ×1
SUT PDS AB 1 TP1 96 (SUTURE) ×3 IMPLANT
SUT PLAIN 2 0 XLH (SUTURE) ×3 IMPLANT
SUT VIC AB 0 CT1 36 (SUTURE) ×9 IMPLANT
SUTURE MNCRL 4-0 27XMF (SUTURE) ×1 IMPLANT
SWABSTK COMLB BENZOIN TINCTURE (MISCELLANEOUS) IMPLANT

## 2016-10-20 NOTE — Anesthesia Procedure Notes (Signed)
Spinal  Patient location during procedure: OR Staffing Anesthesiologist: Elfida Shimada Performed: anesthesiologist  Preanesthetic Checklist Completed: patient identified, site marked, surgical consent, pre-op evaluation, timeout performed, IV checked and risks and benefits discussed Spinal Block Patient position: sitting Prep: Betadine Patient monitoring: heart rate, cardiac monitor, continuous pulse ox and blood pressure Approach: midline Location: L3-4 Injection technique: single-shot Needle Needle type: Pencil-Tip  Needle gauge: 25 G Needle length: 9 cm Assessment Sensory level: T10 Additional Notes Marcaine 1.6ml.     

## 2016-10-20 NOTE — Anesthesia Preprocedure Evaluation (Addendum)
Anesthesia Evaluation  Patient identified by MRN, date of birth, ID band Patient awake    Reviewed: Allergy & Precautions, NPO status , Patient's Chart, lab work & pertinent test results, reviewed documented beta blocker date and time   Airway Mallampati: II  TM Distance: >3 FB     Dental  (+) Chipped   Pulmonary former smoker,           Cardiovascular      Neuro/Psych    GI/Hepatic   Endo/Other    Renal/GU      Musculoskeletal   Abdominal   Peds  Hematology   Anesthesia Other Findings Obese.  Reproductive/Obstetrics                            Anesthesia Physical Anesthesia Plan  ASA: II  Anesthesia Plan: Spinal   Post-op Pain Management:    Induction:   Airway Management Planned:   Additional Equipment:   Intra-op Plan:   Post-operative Plan:   Informed Consent: I have reviewed the patients History and Physical, chart, labs and discussed the procedure including the risks, benefits and alternatives for the proposed anesthesia with the patient or authorized representative who has indicated his/her understanding and acceptance.     Plan Discussed with: CRNA  Anesthesia Plan Comments:         Anesthesia Quick Evaluation

## 2016-10-20 NOTE — Discharge Summary (Signed)
OB Discharge Summary     Patient Name: Roberta Bishop DOB: [birth year] MRN: [id number]  Date of admission: 10/20/2016 Delivering MD: Will Bonnet, MD  Date of Delivery: 10/20/2016  Date of discharge: 10/21/2016  Admitting diagnosis: previous cesarean section Intrauterine pregnancy: [redacted]w[redacted]d    Secondary diagnosis: Anemia     Discharge diagnosis: Term Pregnancy Delivered and Anemia , gonorrhea cervicitis, chlamydia cervicitis                                                               Post partum procedures:Treatmetn for gonorrhea and chlamydia cervicitis per CDC guidelines  Augmentation: N/A  Complications: None  Hospital course:  The patient was admitted in active labor and desired a repeat cesarean delivery. She was taken to the operating room for delivery, which occurred without apparent complication.  She had a routine postpartum course. Of note, the infant was noted to have a cleft lip and likely palate at the time of delivery.  On hospital day #1 she was noted to have a hemoglobin of 7.6 (down from 10.0 on admission). She was asymptomatic from this.  She was also screened for gonorrhea and chlamydia at admission and both were positive. She was treated with ceftriaxone '250mg'$  IM x1 and azithromycin 1,000 mg PO x 1.  She had no prenatal care prior to her admission.   Labs at admission are as follows: O positive / ab screen neg / RPR NR / HIV neg / Rubella Non-Immune / Varicella Immune / Hep B surface Ag neg / UDS negative /   Her infant was born with a cleft lip and palate and [facility]  The patient requested transfer to be with infant.  Dr. GLeward Quanwas contacted and accepted transfer of patient.   Physical exam  Vitals:   10/21/16 0000 10/21/16 0246 10/21/16 0735 10/21/16 1133  BP: 111/70 125/85 120/74 123/69  Pulse: 92 88 84 90  Resp: '18 18 20 18  '$ Temp: 98.2 F (36.8 C) 98 F (36.7 C) 98.3 F (36.8 C) 98.4  F (36.9 C)  TempSrc: Oral Oral Oral Oral  SpO2: 98% 97% 99%   Weight:      Height:       General: alert, cooperative and no distress Lochia: appropriate Uterine Fundus: firm Incision: Healing well with no significant drainage, Dressing is clean, dry, and intact DVT Evaluation: No evidence of DVT seen on physical exam. No cords or calf tenderness. No significant calf/ankle edema.  Labs: Lab Results  Component Value Date   WBC 14.6 (H) 10/21/2016   HGB 7.6 (L) 10/21/2016   HCT 23.4 (L) 10/21/2016   MCV 79.0 (L) 10/21/2016   PLT 247 10/21/2016   CMP Latest Ref Rng & Units 03/25/2016  Glucose 65 - 99 mg/dL 103(H)  BUN 6 - 20 mg/dL 9  Creatinine 0.44 - 1.00 mg/dL 0.55  Sodium 135 - 145 mmol/L 135  Potassium 3.5 - 5.1 mmol/L 3.7  Chloride 101 - 111 mmol/L 105  CO2 22 - 32 mmol/L 20(L)  Calcium 8.9 - 10.3 mg/dL 9.2  Total Protein 6.5 - 8.1 g/dL 7.7  Total Bilirubin 0.3 - 1.2 mg/dL 0.7  Alkaline Phos 38 - 126 U/L 66  AST 15 - 41 U/L 22  ALT 14 - 54 U/L 14    Discharge instruction: per After Visit Summary.  Medications:  Allergies as of 10/21/2016      Reactions   Morphine And Related Nausea And Vomiting      Medication List    You have not been prescribed any medications.     Diet: routine diet  Activity: Advance as tolerated. Pelvic rest for 6 weeks.   Outpatient follow up: Follow-up Information    Will Bonnet, MD Follow up in 1 week(s).   Specialty:  Obstetrics and Gynecology Why:  post op incision check Contact information: [address] [place] [address] [phone]             Postpartum contraception: TBD Rhogam Given postpartum: no Rubella vaccine given postpartum: not given prior to transfer to [place] (needs MMR) Varicella vaccine given postpartum: no  TDaP given antepartum or postpartum: NO  Newborn Data: Live born female  Birth Weight: 8 lb 11.3 oz (3950 g) APGAR: 8, 9  Disposition:transferred to [facility]  NICU  SIGNED: Prentice Docker, MD 10/21/2016 3:41 PM

## 2016-10-20 NOTE — Op Note (Signed)
Cesarean Section Operative Note    Roberta DingwallSamantha L Nickless   10/20/2016   Pre-operative Diagnosis:  1) intrauterine device at 729w0d  2) history of previous cesarean section, desires repeat.   Post-operative Diagnosis:  1) intrauterine device at 859w0d  2) history of previous cesarean section, desires repeat.  Procedure: Repeat Low Transverse Cesarean Section via Pfannenstiel Incision with double layer uterine closure  Surgeon: Surgeon(s) and Role:    * Conard NovakStephen D Shabre Kreher, MD - Primary   [facility]  Anesthesia: spinal   Findings:  1) normal appearing gravid uterus, fallopian tubes, and ovaries 2) viable female infant with weight of 3,940 grams. 3) infant with cleft lip and palate   Estimated Blood Loss: 1,200 mL  Total IV Fluids: 1,800 ml   Urine Output: 200 mL  Specimens: None  Complications: no complications  Disposition: PACU - hemodynamically stable.   Maternal Condition: stable   Baby condition / location:  Special Care Nursery  Procedure Details:  The patient was seen in the Holding Room. The risks, benefits, complications, treatment options, and expected outcomes were discussed with the patient. The patient concurred with the proposed plan, giving informed consent. identified as Roberta Bishop and the procedure verified as C-Section Delivery. A Time Out was held and the above information confirmed.   After induction of anesthesia, the patient was draped and prepped in the usual sterile manner. A Pfannenstiel incision was made and carried down through the subcutaneous tissue to the fascia. Fascial incision was made and extended transversely. The fascia was separated from the underlying rectus tissue superiorly and inferiorly. The peritoneum was identified and entered. Peritoneal incision was extended longitudinally. The bladder flap was sharply freed from the lower uterine segment. A low transverse uterine incision was made and the hysterotomy was extended  with cranial-caudal tension. Delivered from cephalic presentation was a 3,940 gram Living newborn infant(s) or Female with Apgar scores of 8 at one minute and 9 at five minutes. Cord ph was not sent the umbilical cord was clamped and cut cord blood was obtained for evaluation. The placenta was removed Intact and appeared normal. The uterine outline, tubes and ovaries appeared normal. The uterine incision was closed with running locked sutures of 0 Vicryl.  A second layer of the same suture was thrown in an imbricating fashion. Several figure-of-eight stitches were thrown on the right aspect of the incision line in order to obtain hemostasis. She also had a large vessel that was rent along the right aspect of the vesicouterine peritoneum that resulted in blood loss of about 200 mL prior to suture ligation with 0 vicryl.  Hemostasis was assured.  The uterus was returned to the abdomen and the paracolic gutters were cleared of all clots and debris.  The peritoneum was reapproximated using 0-Vicryl in a running fashion.  The rectus muscles were inspected and found to be hemostatic.  The fascia was then reapproximated with running sutures of 1-0 PDS, looped. Three interrupted 3-0 vicryl stitches were thrown along the incision in the subcutaneous tissue to reduce tension on the skin closure.  The subcuticular closure was performed using 4-0 monocryl. The skin closure was reinforced using surgical skin glue.  Instrument, sponge, and needle counts were correct prior the abdominal closure and were correct at the conclusion of the case.  The patient received Ancef 2 gram IV prior to skin incision (within 30 minutes). For VTE prophylaxis she was wearing SCDs throughout the case.  Signed: Conard NovakStephen D. Shayn Madole, MD 10/20/2016 4:48  AM

## 2016-10-20 NOTE — H&P (Signed)
OB History & Physical   History of Present Illness:  Chief Complaint: Complains of lower abdominal pains intermittently since midnight. Has had some mucoid discharge, a little blood tinged. Last ate around 2200. HPI:  Roberta Bishop is a 25 y.o. G2P1 female with EDC12/20/2018 at 4567w0d dated by a 10 wk 1 day ultrasound.  Her pregnancy has been complicated by no prenatal care and previous Cesarean section in 2014 for failure to descend..  She presents to L&D for evaluation of labor.  Prenatal care site: no prenatal care.      Maternal Medical History:   Past Medical History:  Diagnosis Date  . No prenatal care in current pregnancy    Denies heart disease, asthma, CHTN, kidney disease, and thyroid problems. Has a panic disorder/anxiety, but has never had treatment. Past Surgical History:  Procedure Laterality Date  . CESAREAN SECTION  09/24/2013    Allergies  Allergen Reactions  . Morphine And Related Nausea And Vomiting    Prior to Admission medications   Not on File          Social History: She  reports that she quit smoking about 20 months ago. Her smoking use included Cigarettes. She has never used smokeless tobacco. She reports that she does not drink alcohol or use drugs.  Family History: family history is not on file.   Review of Systems: Negative x 10 systems reviewed except as noted in the HPI.      Physical Exam:  Vital Signs: BP (!) 141/94 (BP Location: Left Arm)   Pulse 89   Temp 98.4 F (36.9 C) (Oral)   Resp 18   Ht 5\' 2"  (1.575 m)   Wt 180 lb (81.6 kg)   LMP 01/25/2016 (Exact Date)   SpO2 100%   BMI 32.92 kg/m  General: breathing with contractions, griping bed HEENT: normocephalic, atraumatic Heart: regular rate & rhythm.  No murmurs Lungs: clear to auscultation bilaterally Abdomen: soft, gravid, non-tender;  EFW: 7# Pelvic:   External: Normal external female genitalia  Cervix: Dilation: 4 / Effacement (%): 80 /  -1   Nitrazine  negative   Extremities: non-tender, symmetric, trace edema bilaterally.  DTRs:+3  Neurologic: Alert & oriented x 3.    Pertinent Results:  Prenatal Labs: Blood type/Rh O positive  Antibody screen   Rubella   RPR   HBsAg   HIV   GC   Chlamydia   Genetic screening   1 hour GTT   3 hour GTT   GBS    Baseline FHR: Bedside Ultrasound:  Number of Fetus: singleton  Presentation: cephalic  Fluid:   Placental Location: fundal  Assessment:  Roberta DingwallSamantha L Dewberry is a 25 y.o. G2P1 female at 2367w0d with previous Cesarean section in labor Desires repeat Cesarean section  No prenatal care  Plan:  1. Admit to Labor & Delivery - notified attending & anesthesia.   2. CBC, T&S, NPO, IVF, NOB labs 3. GBS unknown 4. Consents for surgery and blood transfusion obtained. Discussed risks of TOLAC including uterine rupture and risks of Cesarean section including bleeding, infection, injury to other abdominal organs and vessels, and risks of anesthesia. Patient gave consent for both a repeat Cesarean section and if needed a blood transfusion. 5. Kefzol preop  Milford Cilento  10/20/2016 2:01 AM

## 2016-10-20 NOTE — Transfer of Care (Signed)
Immediate Anesthesia Transfer of Care Note  Patient: Roberta Bishop  Procedure(s) Performed: urgent csection  Patient Location: Nursing Unit  Anesthesia Type:Spinal  Level of Consciousness: awake, alert  and oriented  Airway & Oxygen Therapy: Patient Spontanous Breathing  Post-op Assessment: Report given to RN and Post -op Vital signs reviewed and stable  Post vital signs: Reviewed  Last Vitals:  Vitals:   10/20/16 0232 10/20/16 0457  BP: (!) 127/94 (!) 138/98  Pulse: 78 76  Resp:  18  Temp:  36.4 C    Last Pain:  Vitals:   10/20/16 0125  TempSrc: Oral  PainSc:          Complications: No apparent anesthesia complications

## 2016-10-21 ENCOUNTER — Encounter: Payer: Self-pay | Admitting: Obstetrics and Gynecology

## 2016-10-21 ENCOUNTER — Ambulatory Visit (HOSPITAL_COMMUNITY)
Admission: AD | Admit: 2016-10-21 | Discharge: 2016-10-21 | Disposition: A | Discharge status: Home / Self Care | Payer: Medicaid Other | Source: Other Acute Inpatient Hospital | Attending: Obstetrics and Gynecology | Admitting: Obstetrics and Gynecology

## 2016-10-21 DIAGNOSIS — A549 Gonococcal infection, unspecified: Secondary | ICD-10-CM | POA: Diagnosis present

## 2016-10-21 DIAGNOSIS — A749 Chlamydial infection, unspecified: Secondary | ICD-10-CM | POA: Diagnosis present

## 2016-10-21 DIAGNOSIS — R42 Dizziness and giddiness: Secondary | ICD-10-CM | POA: Insufficient documentation

## 2016-10-21 LAB — CBC
HEMATOCRIT: 23.4 % — AB (ref 35.0–47.0)
HEMOGLOBIN: 7.6 g/dL — AB (ref 12.0–16.0)
MCH: 25.7 pg — ABNORMAL LOW (ref 26.0–34.0)
MCHC: 32.5 g/dL (ref 32.0–36.0)
MCV: 79 fL — AB (ref 80.0–100.0)
Platelets: 247 10*3/uL (ref 150–440)
RBC: 2.96 MIL/uL — AB (ref 3.80–5.20)
RDW: 15.3 % — ABNORMAL HIGH (ref 11.5–14.5)
WBC: 14.6 10*3/uL — AB (ref 3.6–11.0)

## 2016-10-21 LAB — VARICELLA ZOSTER ANTIBODY, IGG: Varicella IgG: 673 index (ref >165)

## 2016-10-21 LAB — RPR: RPR Ser Ql: NONREACTIVE

## 2016-10-21 LAB — RUBELLA SCREEN: Rubella: <0.9 index — ABNORMAL LOW (ref >0.99)

## 2016-10-21 MED ORDER — MEASLES, MUMPS & RUBELLA VAC ~~LOC~~ INJ: 0.5000 mL | INJECTION | SUBCUTANEOUS | Status: DC | PRN

## 2016-10-21 MED ORDER — AZITHROMYCIN 250 MG PO TABS
1000.0000 mg | ORAL_TABLET | Freq: Once | ORAL | Status: AC
  Administered 2016-10-21: 1000 mg via ORAL
  Filled 2016-10-21: qty 4

## 2016-10-21 MED ORDER — ALPRAZOLAM 1 MG PO TABS
1.0000 mg | ORAL_TABLET | Freq: Two times a day (BID) | ORAL | Status: DC | PRN
  Administered 2016-10-21: 1 mg via ORAL
  Filled 2016-10-21: qty 1

## 2016-10-21 MED ORDER — SODIUM CHLORIDE 0.9 % IJ SOLN
INTRAMUSCULAR | Status: AC
  Filled 2016-10-21: qty 10

## 2016-10-21 MED ORDER — CEFTRIAXONE SODIUM 250 MG IJ SOLR
250.0000 mg | Freq: Once | INTRAMUSCULAR | Status: AC
  Administered 2016-10-21: 250 mg via INTRAMUSCULAR
  Filled 2016-10-21: qty 250

## 2016-10-21 NOTE — Progress Notes (Signed)
[facility]

## 2016-10-21 NOTE — Clinical Social Work Note (Signed)
CSW consulted for infant being in NICU with cleft palate. Please refer to CSW documentation already in place on infant's medical record. York SpanielMonica Mauriah Mcmillen MSW,LCSW [phone]

## 2016-10-21 NOTE — Progress Notes (Signed)
Patient ID: Roberta Bishop, female   DOB: 11/29/1990, 25 y.o.   MRN: [id number]  Obstetric Postpartum/PostOperative Daily Progress Note Subjective:  25 y.o. G2P1002 status post repeat cesarean delivery.  She is ambulating, is tolerating po, is not voiding spontaneously as her cathter came out 3 hours ago.  Her pain is well controlled on PO pain medications. Her lochia is less than menses.   Medications SCHEDULED MEDICATIONS  . azithromycin  1,000 mg Oral Once  . cefTRIAXone (ROCEPHIN) IM  250 mg Intramuscular Once  . ferrous sulfate  325 mg Oral BID WC  . ibuprofen  600 mg Oral Q6H  . prenatal multivitamin  1 tablet Oral Q1200  . senna-docusate  2 tablet Oral Q24H  . simethicone  80 mg Oral TID PC    MEDICATION INFUSIONS  . lactated ringers 125 mL/hr at 10/21/16 0602    PRN MEDICATIONS  coconut oil, witch hazel-glycerin **AND** dibucaine, diphenhydrAMINE, fentaNYL (SUBLIMAZE) injection, fentaNYL (SUBLIMAZE) injection, HYDROmorphone (DILAUDID) injection, menthol-cetylpyridinium, ondansetron (ZOFRAN) IV, oxyCODONE-acetaminophen, oxyCODONE-acetaminophen    Objective:   Vitals:   10/20/16 1949 10/21/16 0000 10/21/16 0246 10/21/16 0735  BP: (!) 113/55 111/70 125/85 120/74  Pulse: 88 92 88 84  Resp: 20 18 18 20  Temp: 98 F (36.7 C) 98.2 F (36.8 C) 98 F (36.7 C) 98.3 F (36.8 C)  TempSrc: Oral Oral Oral Oral  SpO2: 98% 98% 97% 99%  Weight:      Height:        Current Vital Signs 24h Vital Sign Ranges  T 98.3 F (36.8 C) Temp  Avg: 98.1 F (36.7 C)  Min: 98 F (36.7 C)  Max: 98.6 F (37 C)  BP 120/74 BP  Min: 111/70  Max: 134/76  HR 84 Pulse  Avg: 85.4  Min: 80  Max: 92  RR 20 Resp  Avg: 18.4  Min: 18  Max: 20  SaO2 99 % Not Delivered SpO2  Avg: 97.9 %  Min: 97 %  Max: 99 %       24 Hour I/O Current Shift I/O  Time Ins Outs 12/20 0701 - 12/21 0700 In: 1987.5 [I.V.:1987.5] Out: 3400 [Urine:3400] No intake/output data recorded.  General: NAD Pulmonary: no increased  work of breathing Abdomen: non-distended, non-tender, fundus firm at level of umbilicus Inc: Clean/dry/intact Extremities: no edema, no erythema, no tenderness  Labs:   Recent Labs Lab 10/20/16 0110 10/21/16 0556  WBC 12.9* 14.6*  HGB 10.0* 7.6*  HCT 30.1* 23.4*  PLT 278 247     Assessment:   25 y.o. G2P1002 postoperative day # 1, s/p repeat cesarean section  Plan:  1) Acute blood loss anemia - hemodynamically stable and asymptomatic. Continue to monitor once more ambulatory.   - po ferrous sulfate - Repeat CBC in AM.  2) O POS / Rubella <0.90 (12/20 0110) - MMR prior to discharge/ Varicella Immune  3) TDAP status not up to date. Will order TDaP  4) formula feeding (infant is having trouble with feeding due to the cleft lip and palate issue)/Contraception = to be decided  5) gonorrhea/chlamydia - per CDC treatment recommendations - ceftriaxone 250 mg IM x 1 and azithromycin 1,000 mg PO x 1. Pt aware she has these.  She understands her partner needs to be treated and that she needs a test of cure at her follow up.   5) Disposition: Home POD# 2 or 3.  May need transfer to facility where newborn is transferred.    D. , MD,   FACOG 10/21/2016 9:28 AM     

## 2016-10-21 NOTE — Anesthesia Postprocedure Evaluation (Signed)
Anesthesia Post Note  Patient: Roberta Bishop  Procedure(s) Performed: Procedure(s) (LRB): CESAREAN SECTION (N/A)  Patient location during evaluation: Mother Baby Anesthesia Type: Spinal Level of consciousness: awake, awake and alert and oriented Pain management: pain level controlled Vital Signs Assessment: post-procedure vital signs reviewed and stable Respiratory status: spontaneous breathing, nonlabored ventilation and respiratory function stable Cardiovascular status: blood pressure returned to baseline Postop Assessment: no headache, no backache, patient able to bend at knees and no signs of nausea or vomiting Anesthetic complications: no     Last Vitals:  Vitals:   10/21/16 0246 10/21/16 0735  BP: 125/85 120/74  Pulse: 88 84  Resp: 18 20  Temp: 36.7 C 36.8 C    Last Pain:  Vitals:   10/21/16 0934  TempSrc:   PainSc: 3                  Chiropodisttephanie Rogan Wigley

## 2017-03-02 ENCOUNTER — Emergency Department: Payer: Medicaid Other

## 2017-03-02 ENCOUNTER — Emergency Department
Admission: EM | Admit: 2017-03-02 | Discharge: 2017-03-02 | Disposition: A | Discharge status: Home / Self Care | Payer: Medicaid Other | Attending: Emergency Medicine | Admitting: Emergency Medicine

## 2017-03-02 ENCOUNTER — Encounter: Payer: Self-pay | Admitting: Emergency Medicine

## 2017-03-02 DIAGNOSIS — Z87891 Personal history of nicotine dependence: Secondary | ICD-10-CM | POA: Insufficient documentation

## 2017-03-02 DIAGNOSIS — N83291 Other ovarian cyst, right side: Secondary | ICD-10-CM | POA: Diagnosis not present

## 2017-03-02 DIAGNOSIS — R1032 Left lower quadrant pain: Secondary | ICD-10-CM | POA: Diagnosis present

## 2017-03-02 DIAGNOSIS — N83292 Other ovarian cyst, left side: Secondary | ICD-10-CM | POA: Insufficient documentation

## 2017-03-02 DIAGNOSIS — N83299 Other ovarian cyst, unspecified side: Secondary | ICD-10-CM

## 2017-03-02 LAB — COMPREHENSIVE METABOLIC PANEL
ALBUMIN: 4 g/dL (ref 3.5–5.0)
ALT: 33 U/L (ref 14–54)
AST: 30 U/L (ref 15–41)
Alkaline Phosphatase: 108 U/L (ref 38–126)
Anion gap: 6 (ref 5–15)
BUN: 11 mg/dL (ref 6–20)
CHLORIDE: 107 mmol/L (ref 101–111)
CO2: 23 mmol/L (ref 22–32)
Calcium: 8.9 mg/dL (ref 8.9–10.3)
Creatinine, Ser: 0.69 mg/dL (ref 0.44–1.00)
GFR calc Af Amer: >60 mL/min (ref >60)
GFR calc non Af Amer: >60 mL/min (ref >60)
Glucose, Bld: 97 mg/dL (ref 65–99)
POTASSIUM: 3.9 mmol/L (ref 3.5–5.1)
Sodium: 136 mmol/L (ref 135–145)
Total Bilirubin: 0.6 mg/dL (ref 0.3–1.2)
Total Protein: 7.8 g/dL (ref 6.5–8.1)

## 2017-03-02 LAB — CBC
HEMATOCRIT: 35.3 % (ref 35.0–47.0)
Hemoglobin: 11.6 g/dL — ABNORMAL LOW (ref 12.0–16.0)
MCH: 25.5 pg — ABNORMAL LOW (ref 26.0–34.0)
MCHC: 32.9 g/dL (ref 32.0–36.0)
MCV: 77.3 fL — AB (ref 80.0–100.0)
Platelets: 355 10*3/uL (ref 150–440)
RBC: 4.57 MIL/uL (ref 3.80–5.20)
RDW: 17.3 % — ABNORMAL HIGH (ref 11.5–14.5)
WBC: 15.8 10*3/uL — AB (ref 3.6–11.0)

## 2017-03-02 LAB — URINALYSIS, COMPLETE (UACMP) WITH MICROSCOPIC
BACTERIA UA: NONE SEEN
BILIRUBIN URINE: NEGATIVE
Glucose, UA: NEGATIVE mg/dL
KETONES UR: NEGATIVE mg/dL
NITRITE: NEGATIVE
PH: 5 (ref 5.0–8.0)
Protein, ur: NEGATIVE mg/dL
Specific Gravity, Urine: 1.021 (ref 1.005–1.030)

## 2017-03-02 LAB — LIPASE, BLOOD: LIPASE: 18 U/L (ref 11–51)

## 2017-03-02 LAB — POCT PREGNANCY, URINE: PREG TEST UR: NEGATIVE

## 2017-03-02 MED ORDER — KETOROLAC TROMETHAMINE 30 MG/ML IJ SOLN
15.0000 mg | Freq: Once | INTRAMUSCULAR | Status: AC
  Administered 2017-03-02: 15 mg via INTRAVENOUS
  Filled 2017-03-02: qty 1

## 2017-03-02 MED ORDER — SODIUM CHLORIDE 0.9 % IV BOLUS (SEPSIS)
1000.0000 mL | Freq: Once | INTRAVENOUS | Status: AC
  Administered 2017-03-02: 1000 mL via INTRAVENOUS

## 2017-03-02 MED ORDER — ONDANSETRON HCL 4 MG/2ML IJ SOLN
4.0000 mg | Freq: Once | INTRAMUSCULAR | Status: AC
Start: 2017-03-02 — End: 2017-03-02
  Administered 2017-03-02: 4 mg via INTRAVENOUS
  Filled 2017-03-02: qty 2

## 2017-03-02 NOTE — ED Triage Notes (Signed)
Lower abdominal pain awoke her this am about 4 am. Denies dysuria. Denies vag discharge.

## 2017-03-02 NOTE — ED Provider Notes (Signed)
[facility] Emergency Department Provider Note  ____________________________________________  Time seen: Approximately 11:48 AM  I have reviewed the triage vital signs and the nursing notes.   HISTORY  Chief Complaint Abdominal Pain   HPI Roberta Bishop is a 26 y.o. female with a history of ovarian cysts who presents for evaluation of abdominal pain. Patient reports that she woke up this morningat 4AM with sudden onset of left lower quadrant abdominal pain. The pain has been intermittent, sharp, and located in the left lower quadrant and periumbilical region, radiating to her back, associated with nausea. Pain is worse when she moves. Currently the pain is 7 out of 10. She denies ever having similar pain in the past. She denies fever, chills, vomiting, diarrhea, constipation, dysuria, hematuria, vaginal bleeding. LMP was 2 weeks ago. Last bowel movement was yesterday. No chest pain or shortness of breath. She has had 2 prior C-sections but no other abdominal surgeries.  Past Medical History:  Diagnosis Date  . No prenatal care in current pregnancy     Patient Active Problem List   Diagnosis Date Noted  . Gonorrhea in female 10/21/2016  . Chlamydia 10/21/2016  . Indication for care in labor or delivery 10/20/2016  . Insufficient prenatal care 10/20/2016  . History of cesarean delivery, antepartum 10/20/2016  . Supervision of high-risk pregnancy, third trimester 10/20/2016  . Iron deficiency anemia 10/20/2016  . Indication for care in labor and delivery, antepartum 09/13/2016  . Female pelvic pain 06/10/2016    Past Surgical History:  Procedure Laterality Date  . CESAREAN SECTION  09/24/2013  . CESAREAN SECTION N/A 10/20/2016   Procedure: CESAREAN SECTION;  Surgeon: Conard Novak, MD;  Location: [facility] ORS;  Service: Obstetrics;  Laterality: N/A;    Prior to Admission medications   Not on File    Allergies Morphine and related  No family  history on file.  Social History Social History  Substance Use Topics  . Smoking status: Former Smoker    Types: Cigarettes    Quit date: 02/10/2015  . Smokeless tobacco: Never Used  . Alcohol use No    Review of Systems  Constitutional: Negative for fever. Eyes: Negative for visual changes. ENT: Negative for sore throat. Neck: No neck pain  Cardiovascular: Negative for chest pain. Respiratory: Negative for shortness of breath. Gastrointestinal: + abdominal pain and nausea. No vomiting or diarrhea. Genitourinary: Negative for dysuria. Musculoskeletal: Negative for back pain. Skin: Negative for rash. Neurological: Negative for headaches, weakness or numbness. Psych: No SI or HI  ____________________________________________   PHYSICAL EXAM:  VITAL SIGNS: ED Triage Vitals  Enc Vitals Group     BP 03/02/17 1035 119/70     Pulse Rate 03/02/17 1035 (!) 105     Resp 03/02/17 1035 20     Temp 03/02/17 1035 98.3 F (36.8 C)     Temp Source 03/02/17 1035 Oral     SpO2 03/02/17 1035 98 %     Weight 03/02/17 1036 170 lb (77.1 kg)     Height 03/02/17 1036  (1.575 m)     Head Circumference --      Peak Flow --      Pain Score 03/02/17 1035 6     Pain Loc --      Pain Edu? --      Excl. in GC? --     Constitutional: Alert and oriented. Well appearing and in no apparent distress. HEENT:      Head:  Normocephalic and atraumatic.         Eyes: Conjunctivae are normal. Sclera is non-icteric. EOMI. PERRL      Mouth/Throat: Mucous membranes are moist.       Neck: Supple with no signs of meningismus. Cardiovascular: Tachycardic with regular rhythm. No murmurs, gallops, or rubs. 2+ symmetrical distal pulses are present in all extremities. No JVD. Respiratory: Normal respiratory effort. Lungs are clear to auscultation bilaterally. No wheezes, crackles, or rhonchi.  Gastrointestinal: Soft, ttp over the LLQ and periumbilical regions, non distended with positive bowel sounds. No  rebound or guarding. Genitourinary: No CVA tenderness. Musculoskeletal: Nontender with normal range of motion in all extremities. No edema, cyanosis, or erythema of extremities. Neurologic: Normal speech and language. Face is symmetric. Moving all extremities. No gross focal neurologic deficits are appreciated. Skin: Skin is warm, dry and intact. No rash noted. Psychiatric: Mood and affect are normal. Speech and behavior are normal.  ____________________________________________   LABS (all labs ordered are listed, but only abnormal results are displayed)  Labs Reviewed  CBC - Abnormal; Notable for the following:       Result Value   WBC 15.8 (*)    Hemoglobin 11.6 (*)    MCV 77.3 (*)    MCH 25.5 (*)    RDW 17.3 (*)    All other components within normal limits  URINALYSIS, COMPLETE (UACMP) WITH MICROSCOPIC - Abnormal; Notable for the following:    Color, Urine YELLOW (*)    APPearance HAZY (*)    Hgb urine dipstick SMALL (*)    Leukocytes, UA TRACE (*)    Squamous Epithelial / LPF 0-5 (*)    All other components within normal limits  WET PREP, GENITAL  CHLAMYDIA/NGC RT PCR ([facility] ONLY)  LIPASE, BLOOD  COMPREHENSIVE METABOLIC PANEL  POC URINE PREG, ED  POCT PREGNANCY, URINE   ____________________________________________  EKG  none  ____________________________________________  RADIOLOGY  TVUS:  Non simple appearing cystic structures in the adnexal regions bilaterally may reflect hemorrhagic cysts. Tubo-ovarian abscesses are felt less likely. A small amount of free fluid is present in the pelvis. Correlation with the patient's laboratory values including beta HCG is needed. If clinical and laboratory findings are consistent with hemorrhagic cysts, follow-up ultrasound in 8-12 weeks is recommended.  Normal vascularity of both ovaries.  Normal appearance of the uterus. Mild prominence of the endometrial stripe without evidence of endometrial fluid  collections or a gestational sac. ____________________________________________   PROCEDURES  Procedure(s) performed: None Procedures Critical Care performed:  None ____________________________________________   INITIAL IMPRESSION / ASSESSMENT AND PLAN / ED COURSE  26 y.o. female with a history of ovarian cysts who presents for evaluation of sudden onset sharp LLQ and periumbilical abdominal pain radiating to the back and associated with nausea. Patient is well-appearing, in no distress, she is afebrile and mildly tachycardic with heart rate of 105. Blood work showing negative pregnancy test, normal CMP, normal lipase, and leukocytosis with white count of 15.8K. UA negative for infection. Ddx ovarian torsion or cyst vs diverticulitis vs appendicitis. Plan for [place] to rule out torsion or cyst. IF that is negative will pursue CT a/p. Will give IVF, IV toradol and IV zofran.  Clinical Course as of Mar 02 1341  Wed Mar 02, 2017  1330 US showing: Non simple appearing cystic structures in the adnexal regions bilaterally may reflect hemorrhagic cysts. Tubo-ovarian abscesses are felt less likely. A small amount of free fluid is present in the pelvis.   Recommended pelvic  exam and testing for STD. Patient declined and tells me that she needs to get out here now to pick up her kids at school. I explained to her that if this is a tubo-ovarian abscess that she can get much sicker causing her to become septic, the infection can spread through her abdomen, she may also develop adhesions which may cause her to have difficulty getting pregnant or even increase her chances of ectopic pregnancy in the future. Patient tells me that she only has one sexual partner and she knows that he does not have any STDs. I told her that because she does not use condoms, the reading of the ultrasound, and her elevated white count that I truly recommended testing. Patient is requesting to go and says she'll follow-up with her  primary care doctor tomorrow to get tested. She understands the risks she is taking. She reports that her pain is now gone after receiving IV Toradol. We'll discharge home at this time and recommended that she returns to the emergency room at any time for further evaluation or sees her PCP as soon as she can.   [CV]    Clinical Course User Index [CV] Nita Sickle, MD    Pertinent labs & imaging results that were available during my care of the patient were reviewed by me and considered in my medical decision making (see chart for details).    ____________________________________________   FINAL CLINICAL IMPRESSION(S) / ED DIAGNOSES  Final diagnoses:  LLQ abdominal pain  Ovarian cyst, complex      NEW MEDICATIONS STARTED DURING THIS VISIT:  New Prescriptions   No medications on file     Note:  This document was prepared using Dragon voice recognition software and may include unintentional dictation errors.    Nita Sickle, MD 03/02/17 [phone]

## 2017-03-02 NOTE — Discharge Instructions (Signed)
As I explained to you based on your blood work and the results of your ultrasound we are unable to rule out an abscess caused by STDs. It is imperative that you get tested for STD. If an abscess is the cause of your pain, this infection may get worse causing you to become extremely sick, causing scarring in your abdomen which may result in infertility or abnormal pregnancies in the future. Please return to the ER at any time if you wish to continue your evaluation. Otherwise follow up with obgyn or your doctor as soon as you can. If you develop worsening abdominal pain, fever, or new symptoms please return to the ER for further management.

## 2017-12-05 ENCOUNTER — Inpatient Hospital Stay: Payer: Medicaid Other | Admitting: Anesthesiology

## 2017-12-05 ENCOUNTER — Encounter
Admission: EM | Disposition: A | Discharge status: Home / Self Care | Payer: Self-pay | Source: Home / Self Care | Attending: Obstetrics and Gynecology

## 2017-12-05 ENCOUNTER — Inpatient Hospital Stay
Admission: EM | Admit: 2017-12-05 | Discharge: 2017-12-08 | DRG: 786 | Disposition: A | Discharge status: Home / Self Care | Payer: Medicaid Other | Attending: Obstetrics and Gynecology | Admitting: Obstetrics and Gynecology

## 2017-12-05 ENCOUNTER — Other Ambulatory Visit: Payer: Self-pay

## 2017-12-05 DIAGNOSIS — O34211 Maternal care for low transverse scar from previous cesarean delivery: Secondary | ICD-10-CM | POA: Diagnosis present

## 2017-12-05 DIAGNOSIS — D62 Acute posthemorrhagic anemia: Secondary | ICD-10-CM | POA: Diagnosis not present

## 2017-12-05 DIAGNOSIS — Z3483 Encounter for supervision of other normal pregnancy, third trimester: Secondary | ICD-10-CM | POA: Diagnosis present

## 2017-12-05 DIAGNOSIS — Z3A00 Weeks of gestation of pregnancy not specified: Secondary | ICD-10-CM

## 2017-12-05 DIAGNOSIS — O9081 Anemia of the puerperium: Secondary | ICD-10-CM | POA: Diagnosis not present

## 2017-12-05 DIAGNOSIS — Z87891 Personal history of nicotine dependence: Secondary | ICD-10-CM | POA: Diagnosis not present

## 2017-12-05 DIAGNOSIS — Z975 Presence of (intrauterine) contraceptive device: Secondary | ICD-10-CM

## 2017-12-05 DIAGNOSIS — O0930 Supervision of pregnancy with insufficient antenatal care, unspecified trimester: Secondary | ICD-10-CM

## 2017-12-05 DIAGNOSIS — Z98891 History of uterine scar from previous surgery: Secondary | ICD-10-CM

## 2017-12-05 DIAGNOSIS — D509 Iron deficiency anemia, unspecified: Secondary | ICD-10-CM | POA: Diagnosis present

## 2017-12-05 DIAGNOSIS — O34219 Maternal care for unspecified type scar from previous cesarean delivery: Secondary | ICD-10-CM | POA: Diagnosis present

## 2017-12-05 HISTORY — DX: Anxiety disorder, unspecified: F41.9

## 2017-12-05 LAB — URINE DRUG SCREEN, QUALITATIVE (ARMC ONLY)
Amphetamines, Ur Screen: NOT DETECTED
Barbiturates, Ur Screen: NOT DETECTED
Benzodiazepine, Ur Scrn: NOT DETECTED
CANNABINOID 50 NG, UR ~~LOC~~: NOT DETECTED
COCAINE METABOLITE, UR ~~LOC~~: NOT DETECTED
MDMA (Ecstasy)Ur Screen: NOT DETECTED
Methadone Scn, Ur: NOT DETECTED
Opiate, Ur Screen: NOT DETECTED
Phencyclidine (PCP) Ur S: NOT DETECTED
TRICYCLIC, UR SCREEN: NOT DETECTED

## 2017-12-05 LAB — CBC
HCT: 29.7 % — ABNORMAL LOW (ref 35.0–47.0)
HEMOGLOBIN: 9.4 g/dL — AB (ref 12.0–16.0)
MCH: 23.9 pg — ABNORMAL LOW (ref 26.0–34.0)
MCHC: 31.8 g/dL — ABNORMAL LOW (ref 32.0–36.0)
MCV: 75.1 fL — ABNORMAL LOW (ref 80.0–100.0)
PLATELETS: 283 10*3/uL (ref 150–440)
RBC: 3.96 MIL/uL (ref 3.80–5.20)
RDW: 16.5 % — ABNORMAL HIGH (ref 11.5–14.5)
WBC: 10.3 10*3/uL (ref 3.6–11.0)

## 2017-12-05 LAB — URINALYSIS, ROUTINE W REFLEX MICROSCOPIC
BILIRUBIN URINE: NEGATIVE
Glucose, UA: NEGATIVE mg/dL
KETONES UR: NEGATIVE mg/dL
Nitrite: NEGATIVE
PROTEIN: NEGATIVE mg/dL
Specific Gravity, Urine: 1.012 (ref 1.005–1.030)
pH: 6 (ref 5.0–8.0)

## 2017-12-05 LAB — WET PREP, GENITAL
Sperm: NONE SEEN
TRICH WET PREP: NONE SEEN
Yeast Wet Prep HPF POC: NONE SEEN

## 2017-12-05 LAB — CHLAMYDIA/NGC RT PCR (ARMC ONLY)
Chlamydia Tr: NOT DETECTED
N gonorrhoeae: NOT DETECTED

## 2017-12-05 LAB — TYPE AND SCREEN
ABO/RH(D): O POS
Antibody Screen: NEGATIVE

## 2017-12-05 SURGERY — Surgical Case
Anesthesia: Spinal | Site: Abdomen | Wound class: Clean Contaminated

## 2017-12-05 MED ORDER — COCONUT OIL OIL: 1.0000 "application " | TOPICAL_OIL | Status: DC | PRN

## 2017-12-05 MED ORDER — SODIUM CHLORIDE 0.9 % IJ SOLN
INTRAMUSCULAR | Status: AC
Start: 2017-12-05 — End: 2017-12-06
  Filled 2017-12-05: qty 50

## 2017-12-05 MED ORDER — DIBUCAINE 1 % RE OINT: 1.0000 "application " | TOPICAL_OINTMENT | RECTAL | Status: DC | PRN

## 2017-12-05 MED ORDER — SIMETHICONE 80 MG PO CHEW
160.0000 mg | CHEWABLE_TABLET | Freq: Four times a day (QID) | ORAL | Status: DC | PRN
  Administered 2017-12-06 (×2): 160 mg via ORAL
  Filled 2017-12-05 (×2): qty 2

## 2017-12-05 MED ORDER — ACETAMINOPHEN 650 MG RE SUPP
650.0000 mg | Freq: Once | RECTAL | Status: AC
  Administered 2017-12-05: 650 mg via RECTAL
  Filled 2017-12-05 (×2): qty 1

## 2017-12-05 MED ORDER — FENTANYL CITRATE (PF) 100 MCG/2ML IJ SOLN
INTRAMUSCULAR | Status: DC | PRN
  Administered 2017-12-05 (×2): 50 ug via INTRAVENOUS

## 2017-12-05 MED ORDER — SODIUM CHLORIDE 0.9 % IV SOLN: 250.0000 mL | INTRAVENOUS | Status: DC

## 2017-12-05 MED ORDER — WITCH HAZEL-GLYCERIN EX PADS: 1.0000 "application " | MEDICATED_PAD | CUTANEOUS | Status: DC | PRN

## 2017-12-05 MED ORDER — LACTATED RINGERS IV SOLN: 500.0000 mL | INTRAVENOUS | Status: DC | PRN

## 2017-12-05 MED ORDER — IBUPROFEN 600 MG PO TABS
600.0000 mg | ORAL_TABLET | Freq: Four times a day (QID) | ORAL | Status: DC
  Administered 2017-12-05 – 2017-12-08 (×11): 600 mg via ORAL
  Filled 2017-12-05 (×11): qty 1

## 2017-12-05 MED ORDER — SODIUM CHLORIDE 0.9% FLUSH: 3.0000 mL | INTRAVENOUS | Status: DC | PRN

## 2017-12-05 MED ORDER — ACETAMINOPHEN 500 MG PO TABS
1000.0000 mg | ORAL_TABLET | Freq: Four times a day (QID) | ORAL | Status: DC
  Administered 2017-12-05 – 2017-12-08 (×9): 1000 mg via ORAL
  Filled 2017-12-05 (×11): qty 2

## 2017-12-05 MED ORDER — MIDAZOLAM HCL 2 MG/2ML IJ SOLN
INTRAMUSCULAR | Status: AC
  Filled 2017-12-05: qty 2

## 2017-12-05 MED ORDER — MEDROXYPROGESTERONE ACETATE 150 MG/ML IM SUSP
150.0000 mg | INTRAMUSCULAR | Status: AC | PRN
  Administered 2017-12-08: 150 mg via INTRAMUSCULAR
  Filled 2017-12-05: qty 1

## 2017-12-05 MED ORDER — CEFAZOLIN SODIUM-DEXTROSE 2-3 GM-%(50ML) IV SOLR
INTRAVENOUS | Status: DC | PRN
  Administered 2017-12-05: 2 g via INTRAVENOUS

## 2017-12-05 MED ORDER — EPINEPHRINE PF 1 MG/ML IJ SOLN
INTRAMUSCULAR | Status: DC | PRN
  Administered 2017-12-05: 1 mg via INTRAVENOUS

## 2017-12-05 MED ORDER — ONDANSETRON HCL 4 MG/2ML IJ SOLN
4.0000 mg | Freq: Once | INTRAMUSCULAR | Status: DC | PRN
Start: 2017-12-05 — End: 2017-12-06

## 2017-12-05 MED ORDER — OXYTOCIN 40 UNITS IN LACTATED RINGERS INFUSION - SIMPLE MED: 2.5000 [IU]/h | INTRAVENOUS | Status: DC

## 2017-12-05 MED ORDER — SODIUM CHLORIDE 0.9 % IJ SOLN
INTRAMUSCULAR | Status: DC | PRN
  Administered 2017-12-05: 50 mL

## 2017-12-05 MED ORDER — CEFTRIAXONE SODIUM 250 MG IJ SOLR
250.0000 mg | Freq: Once | INTRAMUSCULAR | Status: AC
  Administered 2017-12-05: 250 mg via INTRAMUSCULAR
  Filled 2017-12-05: qty 250

## 2017-12-05 MED ORDER — OXYTOCIN 40 UNITS IN LACTATED RINGERS INFUSION - SIMPLE MED
INTRAVENOUS | Status: DC | PRN
  Administered 2017-12-05: 1 mL via INTRAVENOUS
  Administered 2017-12-05: 499 mL via INTRAVENOUS

## 2017-12-05 MED ORDER — OXYCODONE HCL 5 MG PO TABS: 10.0000 mg | ORAL_TABLET | ORAL | Status: DC | PRN

## 2017-12-05 MED ORDER — LACTATED RINGERS IV BOLUS (SEPSIS)
500.0000 mL | Freq: Once | INTRAVENOUS | Status: AC
  Administered 2017-12-05: 500 mL via INTRAVENOUS

## 2017-12-05 MED ORDER — OXYCODONE HCL 5 MG PO TABS: 5.0000 mg | ORAL_TABLET | ORAL | Status: DC | PRN

## 2017-12-05 MED ORDER — CEFAZOLIN SODIUM-DEXTROSE 2-4 GM/100ML-% IV SOLN
2.0000 g | INTRAVENOUS | Status: DC
  Filled 2017-12-05: qty 100

## 2017-12-05 MED ORDER — BUPIVACAINE LIPOSOME 1.3 % IJ SUSP
INTRAMUSCULAR | Status: AC
  Filled 2017-12-05: qty 20

## 2017-12-05 MED ORDER — LACTATED RINGERS IV SOLN: INTRAVENOUS | Status: DC

## 2017-12-05 MED ORDER — BUPIVACAINE HCL (PF) 0.5 % IJ SOLN
INTRAMUSCULAR | Status: AC
  Filled 2017-12-05: qty 30

## 2017-12-05 MED ORDER — LIDOCAINE HCL (PF) 1 % IJ SOLN
INTRAMUSCULAR | Status: AC
  Filled 2017-12-05: qty 30

## 2017-12-05 MED ORDER — BUPIVACAINE IN DEXTROSE 0.75-8.25 % IT SOLN
INTRATHECAL | Status: DC | PRN
  Administered 2017-12-05: 1.5 mL via INTRATHECAL

## 2017-12-05 MED ORDER — SENNOSIDES-DOCUSATE SODIUM 8.6-50 MG PO TABS
2.0000 | ORAL_TABLET | ORAL | Status: DC
  Administered 2017-12-05 – 2017-12-07 (×3): 2 via ORAL
  Filled 2017-12-05 (×3): qty 2

## 2017-12-05 MED ORDER — BUPIVACAINE LIPOSOME 1.3 % IJ SUSP
20.0000 mL | Freq: Once | INTRAMUSCULAR | Status: DC
  Filled 2017-12-05: qty 20

## 2017-12-05 MED ORDER — OXYTOCIN 40 UNITS IN LACTATED RINGERS INFUSION - SIMPLE MED
INTRAVENOUS | Status: AC
  Filled 2017-12-05: qty 1000

## 2017-12-05 MED ORDER — BUPIVACAINE LIPOSOME 1.3 % IJ SUSP
INTRAMUSCULAR | Status: DC | PRN
  Administered 2017-12-05: 50 mL

## 2017-12-05 MED ORDER — LACTATED RINGERS IV SOLN
INTRAVENOUS | Status: DC | PRN
  Administered 2017-12-05: 19:00:00 via INTRAVENOUS

## 2017-12-05 MED ORDER — ACETAMINOPHEN 325 MG PO TABS: 650.0000 mg | ORAL_TABLET | ORAL | Status: DC | PRN

## 2017-12-05 MED ORDER — SOD CITRATE-CITRIC ACID 500-334 MG/5ML PO SOLN
30.0000 mL | ORAL | Status: AC
  Administered 2017-12-05: 30 mL via ORAL
  Filled 2017-12-05: qty 15

## 2017-12-05 MED ORDER — LACTATED RINGERS IV SOLN
INTRAVENOUS | Status: DC
  Administered 2017-12-06: 04:00:00 via INTRAVENOUS

## 2017-12-05 MED ORDER — FENTANYL CITRATE (PF) 100 MCG/2ML IJ SOLN
25.0000 ug | INTRAMUSCULAR | Status: DC | PRN
  Administered 2017-12-05 (×4): 25 ug via INTRAVENOUS
  Filled 2017-12-05: qty 2

## 2017-12-05 MED ORDER — DIPHENHYDRAMINE HCL 25 MG PO CAPS: 25.0000 mg | ORAL_CAPSULE | Freq: Four times a day (QID) | ORAL | Status: DC | PRN

## 2017-12-05 MED ORDER — PRENATAL MULTIVITAMIN CH
1.0000 | ORAL_TABLET | Freq: Every day | ORAL | Status: DC
  Administered 2017-12-06 – 2017-12-08 (×3): 1 via ORAL
  Filled 2017-12-05 (×3): qty 1

## 2017-12-05 MED ORDER — MENTHOL 3 MG MT LOZG
1.0000 | LOZENGE | OROMUCOSAL | Status: DC | PRN
  Filled 2017-12-05: qty 9

## 2017-12-05 MED ORDER — DEXTROSE 5 % IV SOLN
500.0000 mg | Freq: Once | INTRAVENOUS | Status: AC
  Administered 2017-12-05: 500 mg via INTRAVENOUS
  Filled 2017-12-05 (×2): qty 500

## 2017-12-05 MED ORDER — FENTANYL CITRATE (PF) 100 MCG/2ML IJ SOLN
INTRAMUSCULAR | Status: AC
  Filled 2017-12-05: qty 2

## 2017-12-05 MED ORDER — SODIUM CHLORIDE 0.9% FLUSH: 3.0000 mL | Freq: Two times a day (BID) | INTRAVENOUS | Status: DC

## 2017-12-05 MED ORDER — MEASLES, MUMPS & RUBELLA VAC ~~LOC~~ INJ
0.5000 mL | INJECTION | Freq: Once | SUBCUTANEOUS | Status: AC
  Administered 2017-12-08: 0.5 mL via SUBCUTANEOUS
  Filled 2017-12-05: qty 0.5

## 2017-12-05 SURGICAL SUPPLY — 32 items
CANISTER SUCT 3000ML PPV (MISCELLANEOUS) ×3 IMPLANT
CLOSURE WOUND 1/2 X4 (GAUZE/BANDAGES/DRESSINGS) ×1
DERMABOND ADVANCED (GAUZE/BANDAGES/DRESSINGS) ×2
DERMABOND ADVANCED .7 DNX12 (GAUZE/BANDAGES/DRESSINGS) ×1 IMPLANT
DRSG TELFA 3X8 NADH (GAUZE/BANDAGES/DRESSINGS) ×3 IMPLANT
ELECT CAUTERY BLADE 6.4 (BLADE) ×3 IMPLANT
ELECT REM PT RETURN 9FT ADLT (ELECTROSURGICAL) ×3
ELECTRODE REM PT RTRN 9FT ADLT (ELECTROSURGICAL) ×1 IMPLANT
GAUZE SPONGE 4X4 12PLY STRL (GAUZE/BANDAGES/DRESSINGS) ×3 IMPLANT
GLOVE PI ORTHOPRO 6.5 (GLOVE) ×2
GLOVE PI ORTHOPRO STRL 6.5 (GLOVE) ×1 IMPLANT
GLOVE SURG SYN 6.5 ES PF (GLOVE) ×3 IMPLANT
GOWN STRL REUS W/ TWL LRG LVL3 (GOWN DISPOSABLE) ×3 IMPLANT
GOWN STRL REUS W/TWL LRG LVL3 (GOWN DISPOSABLE) ×6
NEEDLE HYPO 22GX1.5 SAFETY (NEEDLE) ×3 IMPLANT
NS IRRIG 1000ML POUR BTL (IV SOLUTION) ×3 IMPLANT
PACK C SECTION AR (MISCELLANEOUS) ×3 IMPLANT
PAD OB MATERNITY 4.3X12.25 (PERSONAL CARE ITEMS) ×3 IMPLANT
PAD PREP 24X41 OB/GYN DISP (PERSONAL CARE ITEMS) ×3 IMPLANT
STRAP SAFETY 5IN WIDE (MISCELLANEOUS) ×3 IMPLANT
STRIP CLOSURE SKIN 1/2X4 (GAUZE/BANDAGES/DRESSINGS) ×2 IMPLANT
SUT MNCRL 4-0 (SUTURE) ×2
SUT MNCRL 4-0 27XMFL (SUTURE) ×1
SUT PDS AB 1 TP1 96 (SUTURE) ×3 IMPLANT
SUT VIC AB 0 CT1 36 (SUTURE) ×6 IMPLANT
SUT VIC AB 2-0 CT1 27 (SUTURE) ×2
SUT VIC AB 2-0 CT1 TAPERPNT 27 (SUTURE) ×1 IMPLANT
SUT VIC AB 3-0 SH 27 (SUTURE) ×2
SUT VIC AB 3-0 SH 27X BRD (SUTURE) ×1 IMPLANT
SUTURE MNCRL 4-0 27XMF (SUTURE) ×1 IMPLANT
SWABSTK COMLB BENZOIN TINCTURE (MISCELLANEOUS) ×3 IMPLANT
SYR 30ML LL (SYRINGE) ×6 IMPLANT

## 2017-12-05 NOTE — Discharge Summary (Signed)
Obstetric Discharge Summary   Patient ID: Patient Name: Roberta Bishop DOB: 07/21/1991 MRN: [id number]  Date of Admission: 12/05/2017 Date of Delivery: 12/05/17 Delivered by: Larey Days, MD Date of Discharge: 12/08/2017  Primary OB: No prenatal care LMP:LMP unknown - unknown gestational age [facility] Estimated Date of Delivery unknown Gestational Age at Delivery: Unknown   Antepartum complications:  1. No prenatal care 2. Previous C/S x 2, 09/2013 and 10/2016 3. 2nd baby born with cleft lip/palate 4. Anemia   Admitting Diagnosis: L&D indication for care  Secondary Diagnoses: Patient Active Problem List   Diagnosis Date Noted  . Previous cesarean delivery affecting pregnancy, antepartum 12/05/2017  . Gonorrhea in female 10/21/2016  . Chlamydia 10/21/2016  . Indication for care in labor or delivery 10/20/2016  . No prenatal care in current pregnancy 10/20/2016  . History of cesarean delivery, antepartum 10/20/2016  . Supervision of high-risk pregnancy, third trimester 10/20/2016  . Iron deficiency anemia 10/20/2016  . Indication for care in labor and delivery, antepartum 09/13/2016  . Female pelvic pain 06/10/2016    Augmentation: None Complications: None Intrapartum complications/course: patient presented in labor with no prenatal care and unknown gestational age, no recollection of LMP.  Presented as unassigned patient to L&D with persistent and worsening contractions; found to be in labor.  Had 2 previous cesarean and declined trial of labor. Was brought to OR for uncomplicated cesarean. Delivery Type: repeat cesarean section, low transverse incision Anesthesia: spinal Placenta: spontaneous Laceration: n/a Episiotomy: n/a  Newborn Data: Live born female  Birth Weight: 7 lb 7.6 oz (3390 g) APGAR: 8, 8   Postpartum Course  Patient had an uncomplicated postpartum course.  By time of discharge on POD#3, her pain was controlled on oral pain medications; she had appropriate  lochia and was ambulating, voiding without difficulty and tolerating regular diet.  She was deemed stable for discharge to home.       Labs: CBC Latest Ref Rng & Units 12/07/2017 12/06/2017 12/05/2017  WBC 3.6 - 11.0 K/uL 11.0 10.4 10.3  Hemoglobin 12.0 - 16.0 g/dL 8.0(L) 8.0(L) 9.4(L)  Hematocrit 35.0 - 47.0 % 25.3(L) 25.2(L) 29.7(L)  Platelets 150 - 440 K/uL 213 218 283   O POS  Physical exam:  BP 125/78 (BP Location: Right Arm)   Pulse 71   Temp 98.8 F (37.1 C) (Oral)   Resp 18   Ht '5\' 2"'$  (1.575 m)   Wt 78.5 kg (173 lb)   LMP  (LMP Unknown)   SpO2 97%   BMI 31.64 kg/m  General: alert and no distress Pulm: normal respiratory effort Lochia: appropriate Abdomen: soft, NT Uterine Fundus: firm, below umbilicus Incision: c/d/i, healing well, no significant drainage, no dehiscence, no significant erythema Extremities: No evidence of DVT seen on physical exam. No lower extremity edema.   Disposition: stable, discharge to home Baby Feeding: formula Baby Disposition: staying in NICU for low O2  Contraception: depo at discharge, requests tubal.  Papers signed  Prenatal Labs:  Blood type/Rh O Pos per previous delivery admission.   Antibody screen done 12/05/17- all results pending  Rubella non-immune 10/20/16  Varicella Immune 10/20/16  RPR non-immune 12/05/17  HBsAg negative 12/05/17  HIV non-reactive 12/06/17  GC 12/05/17  neg  Chlamydia 12/05/17    GBS  done 12/05/17- pending    MMR given at discharge Rh Immune globulin given: n/a  Plan:  Roberta Bishop was discharged to home in good condition. Follow-up appointment at [facility] with Dr. Leonides Schanz in  2 weeks  Discharge Instructions: Per After Visit Summary. Activity: Advance as tolerated. Pelvic rest for 6 weeks.   Diet: Regular Discharge Medications: Allergies as of 12/08/2017      Reactions   Morphine And Related Nausea And Vomiting      Medication List    TAKE these medications   ibuprofen 600 MG  tablet Commonly known as:  ADVIL,MOTRIN Take 1 tablet (600 mg total) by mouth every 6 (six) hours.   multivitamin-prenatal 27-0.8 MG Tabs tablet Take 1 tablet by mouth daily at 12 noon.   oxyCODONE 5 MG immediate release tablet Commonly known as:  Oxy IR/ROXICODONE Take 1 tablet (5 mg total) by mouth every 4 (four) hours as needed (pain scale 4-7).            Discharge Care Instructions  (From admission, onward)        Start     Ordered   12/08/17 0000  Discharge wound care:    Comments:  Keep incision dry, clean.   12/08/17 1242     Outpatient follow up:  Follow-up Information    Ward, Honor Loh, MD Follow up in 2 week(s).   Specialty:  Obstetrics and Gynecology Contact information: [address] [place] [address] [phone]            Signed:  ----- Larey Days, MD Attending Obstetrician and Gynecologist [facility], Department of [facility]

## 2017-12-05 NOTE — OB Triage Note (Signed)
Pt presents c/o increased pressure since last night. Denies any pain, but states she is very uncomfortable. Reports positive fetal movement. Denies any LOF or bleeding. Vitals WNL. Will continue to monitor.

## 2017-12-05 NOTE — Anesthesia Procedure Notes (Signed)
Spinal  Patient location during procedure: OR Staffing Anesthesiologist: Cissy Galbreath, MD Performed: anesthesiologist  Preanesthetic Checklist Completed: patient identified, site marked, surgical consent, pre-op evaluation, timeout performed, IV checked and risks and benefits discussed Spinal Block Patient position: sitting Prep: ChloraPrep Patient monitoring: heart rate, cardiac monitor, continuous pulse ox and blood pressure Approach: midline Location: L3-4 Injection technique: single-shot Needle Needle type: Pencil-Tip  Needle gauge: 25 G Needle length: 9 cm Assessment Sensory level: T10     

## 2017-12-05 NOTE — Anesthesia Preprocedure Evaluation (Signed)
Anesthesia Evaluation  Patient identified by MRN, date of birth, ID band Patient awake    Reviewed: Allergy & Precautions, NPO status , Patient's Chart, lab work & pertinent test results, reviewed documented beta blocker date and time   Airway Mallampati: III  TM Distance: >3 FB     Dental  (+) Chipped   Pulmonary former smoker,           Cardiovascular      Neuro/Psych Anxiety    GI/Hepatic   Endo/Other    Renal/GU      Musculoskeletal   Abdominal   Peds  Hematology  (+) anemia ,   Anesthesia Other Findings Obese.  Reproductive/Obstetrics                             Anesthesia Physical Anesthesia Plan  ASA: II  Anesthesia Plan: Spinal   Post-op Pain Management:    Induction:   PONV Risk Score and Plan:   Airway Management Planned:   Additional Equipment:   Intra-op Plan:   Post-operative Plan:   Informed Consent: I have reviewed the patients History and Physical, chart, labs and discussed the procedure including the risks, benefits and alternatives for the proposed anesthesia with the patient or authorized representative who has indicated his/her understanding and acceptance.     Plan Discussed with: CRNA  Anesthesia Plan Comments:         Anesthesia Quick Evaluation

## 2017-12-05 NOTE — Interval H&P Note (Signed)
History and Physical Interval Note:  12/05/2017 6:24 PM  Roberta Bishop  Presented to L&D with contractions, made cervical change.  She has had no prenatal care, has no idea how far along she is in this pregnancy, but thinks she's "at the end".  She has had two previous cesarean deliveries, this first for failure to descend after pushing for 4 hours. She requests a repeat cesarean.  The various methods of treatment have been discussed with the patient and family. After consideration of risks, benefits and other options for treatment, the patient has consented to  Procedure(s): CESAREAN SECTION (N/A) as a surgical intervention .  The patient's history has been reviewed, patient examined, no change in status, stable for surgery.  I have reviewed the patient's chart and labs.  Questions were answered to the patient's satisfaction.    ----- Ranae Plumberhelsea Ward, MD Attending Obstetrician and Gynecologist [facility], Department of OB/GYN [facility]

## 2017-12-05 NOTE — H&P (Signed)
OB History & Physical   History of Present Illness:  Chief Complaint: Pelvic pressure since last night  HPI:  Roberta Bishop is a 27 y.o. G88P2002 female at Unknown gestation. Pt reports having had prenatal care in [place], but unable to state due date or location of care. Reports that she had an ultrasound at 6wks. She presents to L&D for pelvic pain and pressure  Active FM Unable to state onset of contractions.  Denies LOF/ROM, reports vaginal discharge. bloody show not noted.     Pregnancy Issues: 1. No prenatal care 2. Previous C/S x 2, 09/2013 and 10/2016 3. 2nd baby born with cleft lip/palate   Maternal Medical History:   Past Medical History:  Diagnosis Date  . Anxiety   . No prenatal care in current pregnancy   Pos gonorrhea and chlamydia in 10/2016  Past Surgical History:  Procedure Laterality Date  . CESAREAN SECTION  09/24/2013  . CESAREAN SECTION N/A 10/20/2016   Procedure: CESAREAN SECTION;  Surgeon: Conard Novak, MD;  Location: [facility] ORS;  Service: Obstetrics;  Laterality: N/A;    Allergies  Allergen Reactions  . Morphine And Related Nausea And Vomiting    Prior to Admission medications   Medication Sig Start Date End Date Taking? Authorizing Provider  Prenatal Vit-Fe Fumarate-FA (MULTIVITAMIN-PRENATAL) 27-0.8 MG TABS tablet Take 1 tablet by mouth daily at 12 noon.   Yes [provider]     Prenatal care site: Pt states prenatal care received in [place], [place]- local health dept contacted, no records available.   Social History: She  reports that she quit smoking about 2 years ago. Her smoking use included cigarettes. she has never used smokeless tobacco. She reports that she does not drink alcohol or use drugs.  Family History: family history is not on file. reports no family hx of HTN, cancers, diabetes.   Review of Systems: A full review of systems was performed and negative except as noted in the HPI.     Physical Exam:  Vital  Signs: BP 126/77 (BP Location: Left Arm)   Pulse 83   Temp 98.4 F (36.9 C) (Oral)   Resp 18   Ht 5\' 2"  (1.575 m)   Wt 173 lb (78.5 kg)   LMP  (LMP Unknown)   SpO2 100%   BMI 31.64 kg/m  General: no acute distress.  HEENT: normocephalic, atraumatic Heart: regular rate & rhythm.  No murmurs/rubs/gallops Lungs: clear to auscultation bilaterally, normal respiratory effort Abdomen: soft, gravid, non-tender;  EFW: 7 lbs by Leopolds Pelvic:   External: Normal external female genitalia  Cervix:   /   /   3-4/80/-3, intact BOW.  Bedside US: cephalic with placenta fundal/anterior.   Extremities: non-tender, symmetric, trace edema bilaterally.  DTRs: 2+ Neurologic: Alert & oriented x 3.    Results for orders placed or performed during the hospital encounter of 12/05/17 (from the past 24 hour(s))  Type and screen [facility]     Status: None (Preliminary result)   Collection Time: 12/05/17  2:47 PM  Result Value Ref Range   ABO/RH(D) PENDING    Antibody Screen PENDING    Sample Expiration      12/08/2017 Performed at [facility], [address] [place], [place] [address]   CBC     Status: Abnormal   Collection Time: 12/05/17  2:47 PM  Result Value Ref Range   WBC 10.3 3.6 - 11.0 K/uL   RBC 3.96 3.80 - 5.20 MIL/uL  Hemoglobin 9.4 (L) 12.0 - 16.0 g/dL   HCT 96.029.7 (L) 45.435.0 - 09.847.0 %   MCV 75.1 (L) 80.0 - 100.0 fL   MCH 23.9 (L) 26.0 - 34.0 pg   MCHC 31.8 (L) 32.0 - 36.0 g/dL   RDW 11.916.5 (H) 14.711.5 - 82.914.5 %   Platelets 283 150 - 440 K/uL    Pertinent Results:  Prenatal Labs: Blood type/Rh O Pos per previous delivery admission.   Antibody screen done 12/05/17- all results pending  Rubella   Varicella   RPR   HBsAg   HIV   GC   Chlamydia   GBS  done 12/05/17- pending   FHT: 135bpm, min-mod variability, + accels, no decels TOCO: q2-606min, palpate mild.     Cephalic by leopolds  No results found.  Assessment:  Roberta Bishop is a 27 y.o.  343P2002 female at Unknown with pelvic pressure and pain.    Plan:  1. Admit to Labor & Delivery; consents reviewed and obtained  2. Fetal Well being  - Fetal Tracing: Cat I tracing - Group B Streptococcus ppx indicated: *pending,  - Presentation: cephalic confirmed by bedside US   3. Routine OB: - Prenatal labs pending including urine drug screen - Rh:O Pos per previous admission.  - NPO, IVF: LR bolus then 125cc/hr  4. Monitoring of Labor: obs for cervical change -  Contractions: external toco in place -  Pelvis proven to n/a, requires C/S if laboring.  -  Plan for continuous fetal monitoring   5. Post Partum Planning: - Infant feeding: undecided - Contraception: undecided - needs SW consult  Ocean Kearley A, CNM 12/05/17 3:26 PM

## 2017-12-05 NOTE — Care Management (Signed)
Pt allergic to morphine. Plain macaine used without astromorph.

## 2017-12-05 NOTE — Anesthesia Post-op Follow-up Note (Signed)
Anesthesia QCDR form completed.        

## 2017-12-05 NOTE — Progress Notes (Signed)
Labor Progress Note  Roberta Bishop is a 27 y.o. G3P2002 at approximately term gestation with no prenatal care.   Subjective: feeling more painful UCs now. Breathing through  Objective: BP 126/77 (BP Location: Left Arm)   Pulse 83   Temp 98.4 F (36.9 C) (Oral)   Resp 18   Ht 5\' 2"  (1.575 m)   Wt 173 lb (78.5 kg)   LMP  (LMP Unknown)   SpO2 100%   BMI 31.64 kg/m  Notable VS details: reviewed  Fetal Assessment: FHT:  FHR: 150 bpm, variability: moderate,  accelerations:  Present,  decelerations:  Absent Category/reactivity:  Category I UC:   regular, every 2-4 minutes SVE:    Dilation: 4 cm  Effacement: 100%  Station:  -3  Consistency: soft  Position: anterior  Membrane status: bulging bag of water, intact  Labs: Lab Results  Component Value Date   WBC 10.3 12/05/2017   HGB 9.4 (L) 12/05/2017   HCT 29.7 (L) 12/05/2017   MCV 75.1 (L) 12/05/2017   PLT 283 12/05/2017    Assessment / Plan: Active labor, previous C/S x 2 Cervical change since initial check, will proceed to C/S per d/w Dr Elesa MassedWard.  Anemia No prenatal care  Labor: cervical change after 2hrs, now with bulging membranes Preeclampsia:  no signs or symptoms of toxicity Fetal Wellbeing:  Category I Pain Control:  per anesthesia I/D:  wet prep c/w cervicitis, tx with IM rocephin; IV Azithro; Pre-op with Ancef 2gm per Dr Elesa MassedWard.  Anticipated MOD:  LTCS  McVey, REBECCA A, CNM 12/05/2017, 5:43 PM

## 2017-12-05 NOTE — Transfer of Care (Signed)
Immediate Anesthesia Transfer of Care Note  Patient: Faythe DingwallSamantha L Negrette  Procedure(s) Performed: REPEAT CESAREAN SECTION (N/A Abdomen)  Patient Location: PACU  Anesthesia Type:Spinal  Level of Consciousness: awake, alert  and oriented  Airway & Oxygen Therapy: Patient Spontanous Breathing  Post-op Assessment: Report given to RN and Post -op Vital signs reviewed and stable  Post vital signs: Reviewed and stable  Last Vitals:  Vitals:   12/05/17 1404  BP: 126/77  Pulse: 83  Resp: 18  Temp: 36.9 C  SpO2: 100%    Last Pain:  Vitals:   12/05/17 1405  TempSrc:   PainSc: 3       Patients Stated Pain Goal: 0 (12/05/17 1405)  Complications: No apparent anesthesia complications

## 2017-12-05 NOTE — Op Note (Signed)
Cesarean Section Procedure Note  12/05/2017   Patient:  Roberta Bishop Vila  27 y.o. female at Unknown.  No LMP recorded (lmp unknown). Patient is pregnant. Preoperative diagnosis:  previous c-section Postoperative diagnosis:  previous c-section  PROCEDURE:  Procedure(s) with comments: REPEAT CESAREAN SECTION (N/A) - FEMALE @ 1910 Surgeon:  Surgeon(s) and Role:    * Suhas Estis, Elenora Fenderhelsea C, MD - Primary Assisted by Meriel Picaebecca McVery, CNM Anesthesia:  spinal I/O: Total I/O In: 700 [I.V.:700] Out: 400 [Blood:400] Specimens:  Cord Blood, cord segment Complications: None Apparent Disposition:  VS stable to PACU  Findings: normal uterus, tubes and ovaries bilaterally Live born female  Birth Weight: 7 lb 7.6 oz (3390 g) APGAR: 8, 8  Newborn Delivery   Birth date/time:  12/05/2017 19:10:00 Delivery type:  C-Section, Low Transverse C-section categorization:  Repeat      Indication for procedure: 27 y.o. female at Unknown gestational age, with 2 previous cesareans, first for failure to descend, second repeat, with no prenatal care, who [facility].  Procedure Details   The risks, benefits, complications, treatment options, and expected outcomes were discussed with the patient. Informed consent was obtained. The patient was taken to Operating Room, identified as Roberta Bishop Cade and the procedure verified as a cesarean delivery.   After administration of anesthesia, the patient was prepped and draped in the usual sterile manner, including a vaginal prep. A surgical time out was performed, with the pediatric team present. After confirming adequate anesthesia, a Pfannenstiel incision was made and carried down through the subcutaneous tissue to the fascia. Fascial incision was made and extended transversely. The fascia was separated from the underlying rectus tissue superiorly and inferiorly. The peritoneum was identified and entered. Peritoneal incision was extended  longitudinally.  A low transverse uterine incision was made. Delivered from cephalic presentation was a live born female. Delayed cord clamping was performed for 60 seconds, while we [facility]. The umbilical cord was doubly clamped and cut, and the baby was handed off to the awaitng pediatrician.  Cord blood was obtained for evaluation. The placenta was removed intact and appeared normal. The uterus was delivered from the abdominal cavity and cleared of clots, membranes, and debris. The uterus, tubes and ovaries appeared normal. The uterine incision was closed with running locking sutures of 0 Vicryl, and then a second, imbricating stitch was placed. Hemostasis was observed. The abdominal cavity was evacuated of extraneous fluid. The uterus was returned to the abdominal cavity and again the incision was inspected for hemostasis, which was confirmed.  The paracolic gutters were cleaned. The fascia was then reapproximated with running suture of vicryl. 60cc of Long- and short-acting bupivicaine was injected circumferentially into the fascia.  After a change of gloves, the subcutaneous tissue was irrigated and reapproximated with 3-0 vicryl. The skin was closed with 4-0 Monocryl and 40cc of long- and short-acting bupivacaine injected into the skin and subcutaneous tissues.  The incision was covered with surgical glue.     Instrument, sponge, and needle counts were correct prior the abdominal closure and at the conclusion of the case.   I was present and performed this procedure in its entirety.  ----- Ranae Plumberhelsea Riannon Mukherjee, MD Attending Obstetrician and Gynecologist [facility], Department of [facility]

## 2017-12-06 ENCOUNTER — Encounter: Payer: Self-pay | Admitting: Obstetrics & Gynecology

## 2017-12-06 LAB — CBC
HEMATOCRIT: 25.2 % — AB (ref 35.0–47.0)
Hemoglobin: 8 g/dL — ABNORMAL LOW (ref 12.0–16.0)
MCH: 24 pg — AB (ref 26.0–34.0)
MCHC: 31.9 g/dL — AB (ref 32.0–36.0)
MCV: 75.4 fL — ABNORMAL LOW (ref 80.0–100.0)
Platelets: 218 10*3/uL (ref 150–440)
RBC: 3.34 MIL/uL — ABNORMAL LOW (ref 3.80–5.20)
RDW: 16.3 % — ABNORMAL HIGH (ref 11.5–14.5)
WBC: 10.4 10*3/uL (ref 3.6–11.0)

## 2017-12-06 LAB — RAPID HIV SCREEN (HIV 1/2 AB+AG)
HIV 1/2 ANTIBODIES: NONREACTIVE
HIV-1 P24 Antigen - HIV24: NONREACTIVE

## 2017-12-06 LAB — RPR: RPR Ser Ql: NONREACTIVE

## 2017-12-06 LAB — HEPATITIS B SURFACE ANTIGEN: Hepatitis B Surface Ag: NEGATIVE

## 2017-12-06 LAB — RUBELLA SCREEN

## 2017-12-06 LAB — VARICELLA ZOSTER ANTIBODY, IGG: Varicella IgG: 623 index (ref >165)

## 2017-12-06 MED ORDER — FERROUS SULFATE 325 (65 FE) MG PO TABS
325.0000 mg | ORAL_TABLET | Freq: Every day | ORAL | Status: DC
  Administered 2017-12-06 – 2017-12-08 (×3): 325 mg via ORAL
  Filled 2017-12-06 (×3): qty 1

## 2017-12-06 NOTE — Anesthesia Postprocedure Evaluation (Signed)
Anesthesia Post Note  Patient: Roberta Bishop  Procedure(s) Performed: REPEAT CESAREAN SECTION (N/A Abdomen)  Patient location during evaluation: Women's Unit Anesthesia Type: Spinal Level of consciousness: awake, awake and alert, oriented and patient cooperative Pain management: pain level controlled Vital Signs Assessment: post-procedure vital signs reviewed and stable Respiratory status: spontaneous breathing, nonlabored ventilation and respiratory function stable Cardiovascular status: stable Postop Assessment: no headache, no backache, patient able to bend at knees, no apparent nausea or vomiting and adequate PO intake Anesthetic complications: no     Last Vitals:  Vitals:   12/06/17 0000 12/06/17 0314  BP: 111/67 112/72  Pulse: 79 75  Resp: 18 18  Temp: 36.4 C 36.7 C  SpO2: 97% 98%    Last Pain:  Vitals:   12/06/17 0314  TempSrc: Oral  PainSc:                  Lyn RecordsNoles,  Philipe Laswell R

## 2017-12-06 NOTE — Clinical Social Work Maternal (Signed)
  CLINICAL SOCIAL WORK MATERNAL/CHILD NOTE  Patient Details  Name: Roberta Bishop MRN: [id number] Date of Birth: 09-18-1991  Date:  12/06/2017  Clinical Social Worker Initiating Note:  Shela Leff MSW,LCSW Date/Time: Initiated:  12/06/17/      Child's Name:      Biological Parents:  Mother   Need for Interpreter:  None   Reason for Referral:  Late or No Prenatal Care    Address:  [place] [address]    Phone number:  [phone] (home)     Additional phone number: none  Household Members/Support Persons (HM/SP):       HM/SP Name Relationship DOB or Age  HM/SP -1        HM/SP -2        HM/SP -3        HM/SP -4        HM/SP -5        HM/SP -6        HM/SP -7        HM/SP -8          Natural Supports (not living in the home):  Immediate Family   Professional Supports: None   Employment: Full-time   Type of Work:     Education:      Homebound arranged:    Museum/gallery curator Resources:  Medicaid   Other Resources:      Cultural/Religious Considerations Which May Impact Care:  none  Strengths:  Ability to meet basic needs , Compliance with medical plan , Home prepared for child , Understanding of illness   Psychotropic Medications:         Pediatrician:       Pediatrician List:   [facility]      Pediatrician Fax Number:    Risk Factors/Current Problems:  None   Cognitive State:  Able to Concentrate , Alert    Mood/Affect:  Calm    CSW Assessment: CSW met with patient this afternoon who was lethargic but was willing to answer all questions and answered them appropriately. CSW explained role and purpose of visit. Patient has stated that she moved to this area from [place] about 4 weeks ago. She stated that she moved to be closer to her family who is all in this area. She is living with her mother currently until she can get her own place  and her other two children, ages 31 and 55 are living with her. Patient states she was able to find a full time job and they will allow her to return to her job after her maternity leave. Patient reports that she did have prenatal care in [place] and that she only did not receive prenatal care toward the end of her pregnancy because of the move and trying to get her records transferred. CSW stated that in her record there was mention of her initially stating to medical staff that she had some prenatal care but then denied having any. Patient stated that this was incorrect information. Patient denied any history of mental illness or substance abuse. Patient stated that she had been educated regarding postpartum depression. She states she has all necessities in the home and has no concerns regarding transportation.   CSW Plan/Description:  No Further Intervention Required/No Barriers to Discharge    Shela Leff, LCSW 12/06/2017, 2:42 PM

## 2017-12-06 NOTE — Progress Notes (Signed)
Post-Op Day 1  Subjective: Doing well, no concerns. 12 hours post-op and has not yet been out of bed, still has IUD in place. Pain managed with PO meds and tolerating regular diet.    No fever/chills, chest pain, shortness of breath, nausea/vomiting, or leg pain. No nipple or breast pain.   Objective: BP 128/85 (BP Location: Right Arm)   Pulse 72   Temp 98.2 F (36.8 C) (Oral)   Resp 20   Ht 5\' 2"  (1.575 m)   Wt 78.5 kg (173 lb)   LMP  (LMP Unknown)   SpO2 99%   BMI 31.64 kg/m    Physical Exam:  General: alert, cooperative, appears stated age and no distress Breasts: soft/nontender CV: RRR Pulm: nl effort, CTABL Abdomen: soft, non-tender, active bowel sounds Uterine Fundus: firm Incision: healing well, no significant drainage, no dehiscence Lochia: appropriate DVT Evaluation: No evidence of DVT seen on physical exam. No cords or calf tenderness. No significant calf/ankle edema.  Recent Labs    12/05/17 1447 12/06/17 0536  HGB 9.4* 8.0*  HCT 29.7* 25.2*  WBC 10.3 10.4  PLT 283 218    Assessment/Plan: 26 y.o. G3P2002 postop day #1  -Continue routine PP care: plan to d/c IUC and IVF today, encourage ambulation -Plans formula feeding, open to trying to breastfeed -Blood loss anemia - hemodynamically stable and asymptomatic; start po ferrous sulfate BID with stool softeners  -Social work consult  Disposition: Continue inpatient PP care.    LOS: 1 day   Genia DelMargaret Mazi Schuff, CNM 12/06/2017, 8:51 AM   ----- Genia DelMargaret Xariah Silvernail Certified Nurse Midwife [facility]

## 2017-12-07 LAB — CULTURE, BETA STREP (GROUP B ONLY)

## 2017-12-07 LAB — CBC
HEMATOCRIT: 25.3 % — AB (ref 35.0–47.0)
HEMOGLOBIN: 8 g/dL — AB (ref 12.0–16.0)
MCH: 24.1 pg — ABNORMAL LOW (ref 26.0–34.0)
MCHC: 31.9 g/dL — AB (ref 32.0–36.0)
MCV: 75.6 fL — ABNORMAL LOW (ref 80.0–100.0)
Platelets: 213 10*3/uL (ref 150–440)
RBC: 3.34 MIL/uL — ABNORMAL LOW (ref 3.80–5.20)
RDW: 16.4 % — ABNORMAL HIGH (ref 11.5–14.5)
WBC: 11 10*3/uL (ref 3.6–11.0)

## 2017-12-07 NOTE — Progress Notes (Signed)
Post Partum Day 2 Subjective: Doing well, no complaints.  Tolerating regular diet, pain with PO meds, voiding and ambulating without difficulty.Pt reports slight tenderness of right calf.   No CP SOB Fever,Chills, N/V; denies nipple or breast pain no HA change of vision, RUQ/epigastric pain  Objective: BP 129/86 (BP Location: Right Arm)   Pulse 83   Temp 97.9 F (36.6 C) (Oral)   Resp 17   Ht '5\' 2"'$  (1.575 m)   Wt 173 lb (78.5 kg)   LMP  (LMP Unknown)   SpO2 99%   BMI 31.64 kg/m    Physical Exam:  General: NAD Breasts: soft/nontender CV: RRR Pulm: nl effort, CTABL Abdomen: soft, NT, BS x 4 Incision:  CDI- Dermabond intact/no erythema or drainage Lochia: moderate Uterine Fundus: fundus firm and 1 fb below umbilicus DVT Evaluation: no cords, ttp LEs; bilateral calves: neg Homans, no redness or warmth.   Recent Labs    12/06/17 0536 12/07/17 0558  HGB 8.0* 8.0*  HCT 25.2* 25.3*  WBC 10.4 11.0  PLT 218 213    Assessment/Plan: 27 y.o. G3P2002 postpartum day # 2  - Continue routine PP care; seen by SW.  -  snug fitting bra and cabbage leaves for bottlefeeding.  - Pt desires Depo prior to Dc and plans for interval sterilization. Will sign BTL consent today.  - Acute blood loss anemia - hemodynamically stable and asymptomatic; continue po ferrous sulfate BID with stool softeners  - Immunization status: Needs tdap, MMR and flu prior to DC   Disposition: Does not desire Dc home today.     [place], [place], CNM 12/07/17

## 2017-12-08 MED ORDER — TETANUS-DIPHTH-ACELL PERTUSSIS 5-2.5-18.5 LF-MCG/0.5 IM SUSP
0.5000 mL | Freq: Once | INTRAMUSCULAR | Status: AC
  Administered 2017-12-08: 0.5 mL via INTRAMUSCULAR
  Filled 2017-12-08: qty 0.5

## 2017-12-08 MED ORDER — OXYCODONE HCL 5 MG PO TABS: 5.0000 mg | ORAL_TABLET | ORAL | 0 refills | Status: AC | PRN

## 2017-12-08 MED ORDER — IBUPROFEN 600 MG PO TABS: 600.0000 mg | ORAL_TABLET | Freq: Four times a day (QID) | ORAL | 0 refills | Status: DC

## 2017-12-08 NOTE — Progress Notes (Signed)
Pt discharged, infant in SCN.  Discharge instructions, prescriptions and follow up appointment given to and reviewed with pt. Pt verbalized understanding. Escorted out by auxillary. 

## 2017-12-08 NOTE — Discharge Instructions (Signed)

## 2018-08-30 ENCOUNTER — Emergency Department: Payer: Self-pay

## 2018-08-30 ENCOUNTER — Other Ambulatory Visit: Payer: Self-pay

## 2018-08-30 ENCOUNTER — Emergency Department
Admission: EM | Admit: 2018-08-30 | Discharge: 2018-08-30 | Disposition: A | Discharge status: Home / Self Care | Payer: Self-pay | Attending: Emergency Medicine | Admitting: Emergency Medicine

## 2018-08-30 DIAGNOSIS — R319 Hematuria, unspecified: Secondary | ICD-10-CM

## 2018-08-30 DIAGNOSIS — Z87891 Personal history of nicotine dependence: Secondary | ICD-10-CM | POA: Insufficient documentation

## 2018-08-30 DIAGNOSIS — R1032 Left lower quadrant pain: Secondary | ICD-10-CM

## 2018-08-30 DIAGNOSIS — Z79899 Other long term (current) drug therapy: Secondary | ICD-10-CM | POA: Insufficient documentation

## 2018-08-30 DIAGNOSIS — N390 Urinary tract infection, site not specified: Secondary | ICD-10-CM | POA: Insufficient documentation

## 2018-08-30 LAB — POCT PREGNANCY, URINE: PREG TEST UR: NEGATIVE

## 2018-08-30 LAB — URINALYSIS, COMPLETE (UACMP) WITH MICROSCOPIC
BILIRUBIN URINE: NEGATIVE
Glucose, UA: NEGATIVE mg/dL
Ketones, ur: NEGATIVE mg/dL
NITRITE: NEGATIVE
PROTEIN: NEGATIVE mg/dL
SPECIFIC GRAVITY, URINE: 1.026 (ref 1.005–1.030)
pH: 5 (ref 5.0–8.0)

## 2018-08-30 LAB — COMPREHENSIVE METABOLIC PANEL
ALT: 38 U/L (ref 0–44)
AST: 25 U/L (ref 15–41)
Albumin: 4.2 g/dL (ref 3.5–5.0)
Alkaline Phosphatase: 89 U/L (ref 38–126)
Anion gap: 10 (ref 5–15)
BUN: 16 mg/dL (ref 6–20)
CHLORIDE: 104 mmol/L (ref 98–111)
CO2: 24 mmol/L (ref 22–32)
Calcium: 9.2 mg/dL (ref 8.9–10.3)
Creatinine, Ser: 0.78 mg/dL (ref 0.44–1.00)
Glucose, Bld: 108 mg/dL — ABNORMAL HIGH (ref 70–99)
POTASSIUM: 3.6 mmol/L (ref 3.5–5.1)
SODIUM: 138 mmol/L (ref 135–145)
Total Bilirubin: 0.4 mg/dL (ref 0.3–1.2)
Total Protein: 7.9 g/dL (ref 6.5–8.1)

## 2018-08-30 LAB — CBC
HEMATOCRIT: 38.4 % (ref 36.0–46.0)
HEMOGLOBIN: 12.4 g/dL (ref 12.0–15.0)
MCH: 28.1 pg (ref 26.0–34.0)
MCHC: 32.3 g/dL (ref 30.0–36.0)
MCV: 87.1 fL (ref 80.0–100.0)
NRBC: 0 % (ref 0.0–0.2)
Platelets: 334 10*3/uL (ref 150–400)
RBC: 4.41 MIL/uL (ref 3.87–5.11)
RDW: 12.4 % (ref 11.5–15.5)
WBC: 8.6 10*3/uL (ref 4.0–10.5)

## 2018-08-30 LAB — LIPASE, BLOOD: LIPASE: 28 U/L (ref 11–51)

## 2018-08-30 MED ORDER — SODIUM CHLORIDE 0.9 % IV SOLN
1.0000 g | Freq: Once | INTRAVENOUS | Status: AC
  Administered 2018-08-30: 1 g via INTRAVENOUS
  Filled 2018-08-30: qty 10

## 2018-08-30 MED ORDER — CIPROFLOXACIN HCL 500 MG PO TABS: 500.0000 mg | ORAL_TABLET | Freq: Two times a day (BID) | ORAL | 0 refills | Status: AC

## 2018-08-30 MED ORDER — KETOROLAC TROMETHAMINE 30 MG/ML IJ SOLN
30.0000 mg | Freq: Once | INTRAMUSCULAR | Status: AC
  Administered 2018-08-30: 30 mg via INTRAVENOUS
  Filled 2018-08-30: qty 1

## 2018-08-30 NOTE — ED Triage Notes (Signed)
Pt c/o left flank pain that radiates around to the lower abd with nausea for the past 2 hours. Denies a hx of kidney stones.

## 2018-08-30 NOTE — ED Notes (Signed)
Patient transported to Ultrasound 

## 2018-08-30 NOTE — ED Provider Notes (Signed)
[facility] Emergency Department Provider Note ____________________________________________   I have reviewed the triage vital signs and the triage nursing note.  HISTORY  Chief Complaint Flank Pain   Historian Patient  HPI Roberta Bishop is a 27 y.o. female presenting for left flank pain that wraps around into the left lower quadrant that started this morning just a few hours ago.  Pain is moderate to severe at worst, currently moderate.  States that she had a sensation like she needed to have a bowel movement but she really did not pass anything this morning.   Denies urinary symptoms including hematuria or dysuria or frequency.  No fevers.  States that she just came off of her.  And denies pregnancy.  No vaginal discharge.  No nausea without vomiting.  No diarrhea.  No reported overuse injury or traumatic injury.  States that the pain is little worse when she moves but feels a little bit better if she puts her hand up against her left flank.  There is no skin rash.    Past Medical History:  Diagnosis Date  . Anxiety   . No prenatal care in current pregnancy     Patient Active Problem List   Diagnosis Date Noted  . Previous cesarean delivery affecting pregnancy, antepartum 12/05/2017  . Gonorrhea in female 10/21/2016  . Chlamydia 10/21/2016  . Indication for care in labor or delivery 10/20/2016  . No prenatal care in current pregnancy 10/20/2016  . History of cesarean delivery, antepartum 10/20/2016  . Supervision of high-risk pregnancy, third trimester 10/20/2016  . Iron deficiency anemia 10/20/2016  . Indication for care in labor and delivery, antepartum 09/13/2016  . Female pelvic pain 06/10/2016    Past Surgical History:  Procedure Laterality Date  . CESAREAN SECTION  09/24/2013  . CESAREAN SECTION N/A 10/20/2016   Procedure: CESAREAN SECTION;  Surgeon: Conard Novak, MD;  Location: [facility] ORS;  Service: Obstetrics;  Laterality:  N/A;  . CESAREAN SECTION N/A 12/05/2017   Procedure: REPEAT CESAREAN SECTION;  Surgeon: Ward, Elenora Fender, MD;  Location: [facility] ORS;  Service: Obstetrics;  Laterality: N/A;  FEMALE @ 1910    Prior to Admission medications   Medication Sig Start Date End Date Taking? Authorizing Provider  ciprofloxacin (CIPRO) 500 MG tablet Take 1 tablet (500 mg total) by mouth 2 (two) times daily. 08/30/18   Governor Rooks, MD  ibuprofen (ADVIL,MOTRIN) 600 MG tablet Take 1 tablet (600 mg total) by mouth every 6 (six) hours. 12/08/17   Ward, Elenora Fender, MD  oxyCODONE (OXY IR/ROXICODONE) 5 MG immediate release tablet Take 1 tablet (5 mg total) by mouth every 4 (four) hours as needed (pain scale 4-7). 12/08/17   Ward, Elenora Fender, MD  Prenatal Vit-Fe Fumarate-FA (MULTIVITAMIN-PRENATAL) 27-0.8 MG TABS tablet Take 1 tablet by mouth daily at 12 noon.    [provider]    Allergies  Allergen Reactions  . Morphine And Related Nausea And Vomiting    No family history on file.  Social History Social History   Tobacco Use  . Smoking status: Former Smoker    Types: Cigarettes    Last attempt to quit: 02/10/2015    Years since quitting: 3.5  . Smokeless tobacco: Never Used  Substance Use Topics  . Alcohol use: No  . Drug use: No    Review of Systems  Constitutional: Negative for fever. Eyes: Negative for visual changes. ENT: Negative for sore throat. Cardiovascular: Negative for chest pain. Respiratory: Negative for shortness  of breath. Gastrointestinal: Left lower quadrant pain that wraps around from the back as per HPI Genitourinary: Negative for dysuria. Musculoskeletal: No leg pain or arm pain.  Has left flank pain.  No midline spinal pain. Skin: Negative for rash. Neurological: Negative for headache.  ____________________________________________   PHYSICAL EXAM:  VITAL SIGNS: ED Triage Vitals [08/30/18 0732]  Enc Vitals Group     BP 133/84     Pulse Rate 83     Resp 16     Temp 97.7 F  (36.5 C)     Temp Source Oral     SpO2 98 %     Weight 176 lb (79.8 kg)     Height 5\' 1"  (1.549 m)     Head Circumference      Peak Flow      Pain Score 4     Pain Loc      Pain Edu?      Excl. in GC?      Constitutional: Alert and oriented.  HEENT      Head: Normocephalic and atraumatic.      Eyes: Conjunctivae are normal. Pupils equal and round.       Ears:         Nose: No congestion/rhinnorhea.      Mouth/Throat: Mucous membranes are moist.      Neck: No stridor. Cardiovascular/Chest: Normal rate, regular rhythm.  No murmurs, rubs, or gallops. Respiratory: Normal respiratory effort without tachypnea nor retractions. Breath sounds are clear and equal bilaterally. No wheezes/rales/rhonchi. Gastrointestinal: Soft. No distention, no guarding, no rebound.  Mild tenderness left lower quadrant.  Appropriate tenderness.  No right or upper quadrant tenderness palpation. Genitourinary/rectal:Deferred Musculoskeletal: Nontender with normal range of motion in all extremities. No joint effusions.  No lower extremity tenderness.  No edema. Neurologic:  Normal speech and language. No gross or focal neurologic deficits are appreciated. Skin:  Skin is warm, dry and intact. No rash noted. Psychiatric: Mood and affect are normal. Speech and behavior are normal. Patient exhibits appropriate insight and judgment.   ____________________________________________  LABS (pertinent positives/negatives) I, Governor Rooks, MD the attending physician have reviewed the labs noted below.  Labs Reviewed  COMPREHENSIVE METABOLIC PANEL - Abnormal; Notable for the following components:      Result Value   Glucose, Bld 108 (*)    All other components within normal limits  URINALYSIS, COMPLETE (UACMP) WITH MICROSCOPIC - Abnormal; Notable for the following components:   Color, Urine YELLOW (*)    APPearance HAZY (*)    Hgb urine dipstick LARGE (*)    Leukocytes, UA TRACE (*)    RBC / HPF >50 (*)     Bacteria, UA RARE (*)    All other components within normal limits  URINE CULTURE  LIPASE, BLOOD  CBC  POC URINE PREG, ED  POCT PREGNANCY, URINE    ____________________________________________    EKG I, Governor Rooks, MD, the attending physician have personally viewed and interpreted all ECGs.  None ____________________________________________  RADIOLOGY   Pelvic ultrasound, radiologist report reviewed:  IMPRESSION: 1. There is a dominant follicle on each side, slightly larger on the right than the left. No other extrauterine pelvic mass. There is Doppler signal in each ovary. No findings are felt to be indicative of torsion of the ovaries.  2. Study otherwise unremarkable. __________________________________________  PROCEDURES  Procedure(s) performed: None  Procedures  Critical Care performed: None   ____________________________________________  ED COURSE / ASSESSMENT AND PLAN  Pertinent labs &  imaging results that were available during my care of the patient were reviewed by me and considered in my medical decision making (see chart for details).    Symptoms somewhat sound like possibly for kidney stone from the standpoint location the flank but radiating down into the left lower quadrant, discussed with her the possibility for ovarian etiology.  Ultrasound negative for torsion or other findings such as ovarian cyst.  Urinalysis with concern for urinary tract infection.  We will send a culture but also give first dose antibiotic by Rocephin here in the emerge department.   Patient declined pelvic exam.  Denies vaginal discharge.  States that she had a pelvic exam and testing normal several weeks ago and does not want to have a pelvic exam today.  We discussed potential missed of diagnosis of pelvic infection, but I do agree that based on symptoms it seems less likely.  Urinalysis shows blood as well as bacteria and leukocytes, consistent with urinary tract  infection.  We will go ahead and treat with a dose of Rocephin and 10 days of Cipro for upper urinary tract infection, pyelonephritis based on clinical pain up into the left flank.  No evidence of sepsis.  She is taking p.o. okay.  I think she is okay for outpatient management.    CONSULTATIONS:  None  Patient / Family / Caregiver informed of clinical course, medical decision-making process, and agree with plan.   I discussed return precautions, follow-up instructions, and discharge instructions with patient and/or family.  Discharge Instructions : You are evaluated for left flank pain down to the left abdomen, and your exam shows urinary tract infection which I suspect may be a kidney infection also called pyelonephritis.  Return to the emergency room immediately for any worsening condition including worsening uncontrolled pain, fever, vomiting and cannot keep medications down or concern for dehydration such as not making urine, dizziness, weakness, black or bloody stools, or any other symptoms concerning to you.    ___________________________________________   FINAL CLINICAL IMPRESSION(S) / ED DIAGNOSES   Final diagnoses:  Urinary tract infection with hematuria, site unspecified      ___________________________________________         Note: This dictation was prepared with Dragon dictation. Any transcriptional errors that result from this process are unintentional    Governor Rooks, MD 08/30/18 1211

## 2018-08-30 NOTE — Discharge Instructions (Addendum)
You are evaluated for left flank pain down to the left abdomen, and your exam shows urinary tract infection which I suspect may be a kidney infection also called pyelonephritis.  Return to the emergency room immediately for any worsening condition including worsening uncontrolled pain, fever, vomiting and cannot keep medications down or concern for dehydration such as not making urine, dizziness, weakness, black or bloody stools, or any other symptoms concerning to you.

## 2018-08-30 NOTE — ED Notes (Signed)
Pt refusing pelvic. MD made aware. Pt educated on risk and benefits to procedure based on symptoms.

## 2018-08-31 LAB — URINE CULTURE

## 2019-03-31 ENCOUNTER — Other Ambulatory Visit: Payer: Self-pay

## 2019-03-31 DIAGNOSIS — N939 Abnormal uterine and vaginal bleeding, unspecified: Secondary | ICD-10-CM | POA: Insufficient documentation

## 2019-03-31 DIAGNOSIS — Z79899 Other long term (current) drug therapy: Secondary | ICD-10-CM | POA: Insufficient documentation

## 2019-03-31 DIAGNOSIS — Z3202 Encounter for pregnancy test, result negative: Secondary | ICD-10-CM | POA: Insufficient documentation

## 2019-03-31 DIAGNOSIS — R102 Pelvic and perineal pain: Secondary | ICD-10-CM | POA: Insufficient documentation

## 2019-03-31 LAB — CBC WITH DIFFERENTIAL/PLATELET
Abs Immature Granulocytes: 0.03 10*3/uL (ref 0.00–0.07)
Basophils Absolute: 0.1 10*3/uL (ref 0.0–0.1)
Basophils Relative: 1 %
Eosinophils Absolute: 0.2 10*3/uL (ref 0.0–0.5)
Eosinophils Relative: 2 %
HCT: 37.8 % (ref 36.0–46.0)
Hemoglobin: 12.5 g/dL (ref 12.0–15.0)
Immature Granulocytes: 0 %
Lymphocytes Relative: 30 %
Lymphs Abs: 3.3 10*3/uL (ref 0.7–4.0)
MCH: 29.4 pg (ref 26.0–34.0)
MCHC: 33.1 g/dL (ref 30.0–36.0)
MCV: 88.9 fL (ref 80.0–100.0)
Monocytes Absolute: 0.7 10*3/uL (ref 0.1–1.0)
Monocytes Relative: 6 %
Neutro Abs: 6.8 10*3/uL (ref 1.7–7.7)
Neutrophils Relative %: 61 %
Platelets: 318 10*3/uL (ref 150–400)
RBC: 4.25 MIL/uL (ref 3.87–5.11)
RDW: 13.1 % (ref 11.5–15.5)
WBC: 11 10*3/uL — ABNORMAL HIGH (ref 4.0–10.5)
nRBC: 0 % (ref 0.0–0.2)

## 2019-03-31 LAB — HCG, QUANTITATIVE, PREGNANCY: hCG, Beta Chain, Quant, S: <1 m[IU]/mL (ref <5)

## 2019-03-31 LAB — POCT PREGNANCY, URINE: Preg Test, Ur: NEGATIVE

## 2019-03-31 NOTE — ED Triage Notes (Signed)
Patient reports took home pregnancy test 2 weeks ago and it was positive.  Yesterday started with "spotting" that has gotten heavier and darker.

## 2019-04-01 ENCOUNTER — Emergency Department: Payer: Self-pay

## 2019-04-01 ENCOUNTER — Emergency Department
Admission: EM | Admit: 2019-04-01 | Discharge: 2019-04-01 | Disposition: A | Discharge status: Home / Self Care | Payer: Self-pay | Attending: Emergency Medicine | Admitting: Emergency Medicine

## 2019-04-01 DIAGNOSIS — N939 Abnormal uterine and vaginal bleeding, unspecified: Secondary | ICD-10-CM

## 2019-04-01 DIAGNOSIS — R52 Pain, unspecified: Secondary | ICD-10-CM

## 2019-04-01 NOTE — ED Provider Notes (Signed)
[facility] Emergency Department Provider Note    First MD Initiated Contact with Patient 04/01/19 0022     (approximate)  I have reviewed the triage vital signs and the nursing notes.   HISTORY  Chief Complaint Vaginal Bleeding   HPI Roberta Bishop is a 28 y.o. female G5P31 previous miscarriage presents emergency department with history of positive urine pregnancy test obtained at home following Mother's Day.  Patient stated starting today she started to experience vaginal spotting and suprapubic pressure which patient states feels like a "period".  Patient denies any nausea or vomiting.  Patient denies any fever.  Patient denies any urinary symptoms or vaginal discharge.        Past Medical History:  Diagnosis Date  . Anxiety   . No prenatal care in current pregnancy     Patient Active Problem List   Diagnosis Date Noted  . Previous cesarean delivery affecting pregnancy, antepartum 12/05/2017  . Gonorrhea in female 10/21/2016  . Chlamydia 10/21/2016  . Indication for care in labor or delivery 10/20/2016  . No prenatal care in current pregnancy 10/20/2016  . History of cesarean delivery, antepartum 10/20/2016  . Supervision of high-risk pregnancy, third trimester 10/20/2016  . Iron deficiency anemia 10/20/2016  . Indication for care in labor and delivery, antepartum 09/13/2016  . Female pelvic pain 06/10/2016    Past Surgical History:  Procedure Laterality Date  . CESAREAN SECTION  09/24/2013  . CESAREAN SECTION N/A 10/20/2016   Procedure: CESAREAN SECTION;  Surgeon: Conard NovakStephen D Jackson, MD;  Location: [facility] ORS;  Service: Obstetrics;  Laterality: N/A;  . CESAREAN SECTION N/A 12/05/2017   Procedure: REPEAT CESAREAN SECTION;  Surgeon: Ward, Elenora Fenderhelsea C, MD;  Location: [facility] ORS;  Service: Obstetrics;  Laterality: N/A;  FEMALE @ 1910    Prior to Admission medications   Medication Sig Start Date End Date Taking? Authorizing Provider   ciprofloxacin (CIPRO) 500 MG tablet Take 1 tablet (500 mg total) by mouth 2 (two) times daily. 08/30/18   Governor RooksLord, Rebecca, MD  ibuprofen (ADVIL,MOTRIN) 600 MG tablet Take 1 tablet (600 mg total) by mouth every 6 (six) hours. 12/08/17   Ward, Elenora Fenderhelsea C, MD  oxyCODONE (OXY IR/ROXICODONE) 5 MG immediate release tablet Take 1 tablet (5 mg total) by mouth every 4 (four) hours as needed (pain scale 4-7). 12/08/17   Ward, Elenora Fenderhelsea C, MD  Prenatal Vit-Fe Fumarate-FA (MULTIVITAMIN-PRENATAL) 27-0.8 MG TABS tablet Take 1 tablet by mouth daily at 12 noon.    [provider]    Allergies Morphine and related  No family history on file.  Social History Social History   Tobacco Use  . Smoking status: Former Smoker    Types: Cigarettes    Last attempt to quit: 02/10/2015    Years since quitting: 4.1  . Smokeless tobacco: Never Used  Substance Use Topics  . Alcohol use: No  . Drug use: No    Review of Systems Constitutional: No fever/chills Eyes: No visual changes. ENT: No sore throat. Cardiovascular: Denies chest pain. Respiratory: Denies shortness of breath. Gastrointestinal: No abdominal pain.  No nausea, no vomiting.  No diarrhea.  No constipation. Genitourinary: Negative for dysuria.  Positive for vaginal spotting Musculoskeletal: Negative for neck pain.  Negative for back pain. Integumentary: Negative for rash. Neurological: Negative for headaches, focal weakness or numbness.   ____________________________________________   PHYSICAL EXAM:  VITAL SIGNS: ED Triage Vitals  Enc Vitals Group     BP 03/31/19 2119 132/90  Pulse Rate 03/31/19 2119 83     Resp 03/31/19 2119 18     Temp 03/31/19 2119 98 F (36.7 C)     Temp Source 03/31/19 2119 Oral     SpO2 03/31/19 2119 96 %     Weight 03/31/19 2118 83.9 kg (185 lb)     Height 03/31/19 2118 1.549 m (5\' 1" )     Head Circumference --      Peak Flow --      Pain Score 03/31/19 2117 0     Pain Loc --      Pain Edu? --       Excl. in GC? --     Constitutional: Alert and oriented. Well appearing and in no acute distress. Eyes: Conjunctivae are normal. Mouth/Throat: Mucous membranes are moist.  Oropharynx non-erythematous. Neck: No stridor.   Cardiovascular: Normal rate, regular rhythm. Good peripheral circulation. Grossly normal heart sounds. Respiratory: Normal respiratory effort.  No retractions. No audible wheezing. Gastrointestinal: Soft and nontender. No distention.  Musculoskeletal: No lower extremity tenderness nor edema. No gross deformities of extremities. Neurologic:  Normal speech and language. No gross focal neurologic deficits are appreciated.  Skin:  Skin is warm, dry and intact. No rash noted. Psychiatric: Mood and affect are normal. Speech and behavior are normal.  ____________________________________________   LABS (all labs ordered are listed, but only abnormal results are displayed)  Labs Reviewed  CBC WITH DIFFERENTIAL/PLATELET - Abnormal; Notable for the following components:      Result Value   WBC 11.0 (*)    All other components within normal limits  HCG, QUANTITATIVE, PREGNANCY  POCT PREGNANCY, URINE  POC URINE PREG, ED      Procedures   ____________________________________________   INITIAL IMPRESSION / MDM / ASSESSMENT AND PLAN / ED COURSE  As part of my medical decision making, I reviewed the following data within the electronic MEDICAL RECORD NUMBER   28 year old female presenting with above-stated history and physical exam secondary to positive pregnancy test at home with subsequent spotting tonight.  Patient's urine pregnancy test negative in the emergency department.  Patient's hCG quantitative less than 1.  Plan to perform an ultrasound however patient states that she cannot stay in the emergency department to have ultrasound performed.    *Roberta Bishop was evaluated in Emergency Department on 04/01/2019 for the symptoms described in the history of present  illness. She was evaluated in the context of the global COVID-19 pandemic, which necessitated consideration that the patient might be at risk for infection with the SARS-CoV-2 virus that causes COVID-19. Institutional protocols and algorithms that pertain to the evaluation of patients at risk for COVID-19 are in a state of rapid change based on information released by regulatory bodies including the CDC and federal and state organizations. These policies and algorithms were followed during the patient's care in the ED.  Some ED evaluations and interventions may be delayed as a result of limited staffing during the pandemic.*    ____________________________________________  FINAL CLINICAL IMPRESSION(S) / ED DIAGNOSES  Final diagnoses:  Pain  Vaginal bleeding     MEDICATIONS GIVEN DURING THIS VISIT:  Medications - No data to display   ED Discharge Orders    None       Note:  This document was prepared using Dragon voice recognition software and may include unintentional dictation errors.   Darci Current, MD 04/01/19 [phone]

## 2021-05-27 ENCOUNTER — Encounter: Payer: Self-pay | Admitting: *Deleted

## 2021-05-27 ENCOUNTER — Emergency Department
Admission: EM | Admit: 2021-05-27 | Discharge: 2021-05-27 | Disposition: A | Discharge status: Home / Self Care | Payer: Self-pay | Attending: Emergency Medicine | Admitting: Emergency Medicine

## 2021-05-27 ENCOUNTER — Emergency Department: Payer: Self-pay

## 2021-05-27 ENCOUNTER — Other Ambulatory Visit: Payer: Self-pay

## 2021-05-27 DIAGNOSIS — N10 Acute tubulo-interstitial nephritis: Secondary | ICD-10-CM | POA: Insufficient documentation

## 2021-05-27 DIAGNOSIS — F17210 Nicotine dependence, cigarettes, uncomplicated: Secondary | ICD-10-CM | POA: Insufficient documentation

## 2021-05-27 LAB — URINALYSIS, COMPLETE (UACMP) WITH MICROSCOPIC
Bacteria, UA: NONE SEEN
Bilirubin Urine: NEGATIVE
Glucose, UA: NEGATIVE mg/dL
Ketones, ur: NEGATIVE mg/dL
Nitrite: NEGATIVE
Protein, ur: >=300 mg/dL — AB
RBC / HPF: >50 RBC/hpf — ABNORMAL HIGH (ref 0–5)
Specific Gravity, Urine: 1.017 (ref 1.005–1.030)
Squamous Epithelial / LPF: NONE SEEN (ref 0–5)
WBC, UA: >50 WBC/hpf — ABNORMAL HIGH (ref 0–5)
pH: 5 (ref 5.0–8.0)

## 2021-05-27 LAB — POC URINE PREG, ED: Preg Test, Ur: NEGATIVE

## 2021-05-27 MED ORDER — CEPHALEXIN 500 MG PO CAPS: 500.0000 mg | ORAL_CAPSULE | Freq: Four times a day (QID) | ORAL | 0 refills | Status: AC

## 2021-05-27 MED ORDER — CEFTRIAXONE SODIUM 1 G IJ SOLR
1.0000 g | Freq: Once | INTRAMUSCULAR | Status: AC
  Administered 2021-05-27: 1 g via INTRAMUSCULAR
  Filled 2021-05-27: qty 10

## 2021-05-27 NOTE — ED Provider Notes (Signed)
[facility] Emergency Department Provider Note  ____________________________________________  Time seen: Approximately 10:29 PM  I have reviewed the triage vital signs and the nursing notes.   HISTORY  Chief Complaint Back Pain    HPI Roberta Bishop is a 30 y.o. female who presents the emergency department complaining of right flank pain.  Patient has had some increased urination but no dysuria.  No hematuria.  Patient has concerned that she may have a UTI even though she does not have any dysuria.  She also has a strong family history of kidney stones but has never had a kidney stone personally.  No medications prior to arrival.  No vaginal bleeding or discharge.  No concern for pregnancy at this time.  Patient denies any GI complaints.       Past Medical History:  Diagnosis Date   Anxiety    No prenatal care in current pregnancy     Patient Active Problem List   Diagnosis Date Noted   Previous cesarean delivery affecting pregnancy, antepartum 12/05/2017   Gonorrhea in female 10/21/2016   Chlamydia 10/21/2016   Indication for care in labor or delivery 10/20/2016   No prenatal care in current pregnancy 10/20/2016   History of cesarean delivery, antepartum 10/20/2016   Supervision of high-risk pregnancy, third trimester 10/20/2016   Iron deficiency anemia 10/20/2016   Indication for care in labor and delivery, antepartum 09/13/2016   Female pelvic pain 06/10/2016    Past Surgical History:  Procedure Laterality Date   CESAREAN SECTION  09/24/2013   CESAREAN SECTION N/A 10/20/2016   Procedure: CESAREAN SECTION;  Surgeon: Conard Novak, MD;  Location: [facility] ORS;  Service: Obstetrics;  Laterality: N/A;   CESAREAN SECTION N/A 12/05/2017   Procedure: REPEAT CESAREAN SECTION;  Surgeon: Ward, Elenora Fender, MD;  Location: [facility] ORS;  Service: Obstetrics;  Laterality: N/A;  FEMALE @ 1910    Prior to Admission medications   Medication Sig Start Date End  Date Taking? Authorizing Provider  cephALEXin (KEFLEX) 500 MG capsule Take 1 capsule (500 mg total) by mouth 4 (four) times daily for 10 days. 05/27/21 06/06/21 Yes Keely Drennan, Delorise Royals, PA-C  ciprofloxacin (CIPRO) 500 MG tablet Take 1 tablet (500 mg total) by mouth 2 (two) times daily. 08/30/18   Governor Rooks, MD  ibuprofen (ADVIL,MOTRIN) 600 MG tablet Take 1 tablet (600 mg total) by mouth every 6 (six) hours. 12/08/17   Ward, Elenora Fender, MD  oxyCODONE (OXY IR/ROXICODONE) 5 MG immediate release tablet Take 1 tablet (5 mg total) by mouth every 4 (four) hours as needed (pain scale 4-7). 12/08/17   Ward, Elenora Fender, MD  Prenatal Vit-Fe Fumarate-FA (MULTIVITAMIN-PRENATAL) 27-0.8 MG TABS tablet Take 1 tablet by mouth daily at 12 noon.    [provider]    Allergies Morphine and related  No family history on file.  Social History Social History   Tobacco Use   Smoking status: Every Day    Types: Cigarettes    Last attempt to quit: 02/10/2015    Years since quitting: 6.2   Smokeless tobacco: Never  Substance Use Topics   Alcohol use: No   Drug use: No     Review of Systems  Constitutional: No fever/chills Eyes: No visual changes. No discharge ENT: No upper respiratory complaints. Cardiovascular: no chest pain. Respiratory: no cough. No SOB. Gastrointestinal: No abdominal pain.  No nausea, no vomiting.  No diarrhea.  No constipation. Genitourinary: Negative for dysuria. No hematuria.  Positive for urinary  frequency and right flank pain Musculoskeletal: Negative for musculoskeletal pain. Skin: Negative for rash, abrasions, lacerations, ecchymosis. Neurological: Negative for headaches, focal weakness or numbness.  10 System ROS otherwise negative.  ____________________________________________   PHYSICAL EXAM:  VITAL SIGNS: ED Triage Vitals  Enc Vitals Group     BP --      Pulse Rate 05/27/21 2001 94     Resp 05/27/21 2001 18     Temp 05/27/21 2001 98.2 F (36.8 C)      Temp Source 05/27/21 2001 Oral     SpO2 05/27/21 2001 98 %     Weight 05/27/21 1958 178 lb (80.7 kg)     Height 05/27/21 1958 5\' 2"  (1.575 m)     Head Circumference --      Peak Flow --      Pain Score 05/27/21 1958 7     Pain Loc --      Pain Edu? --      Excl. in GC? --      Constitutional: Alert and oriented. Well appearing and in no acute distress. Eyes: Conjunctivae are normal. PERRL. EOMI. Head: Atraumatic. ENT:      Ears:       Nose: No congestion/rhinnorhea.      Mouth/Throat: Mucous membranes are moist.  Neck: No stridor.    Cardiovascular: Normal rate, regular rhythm. Normal S1 and S2.  Good peripheral circulation. Respiratory: Normal respiratory effort without tachypnea or retractions. Lungs CTAB. Good air entry to the bases with no decreased or absent breath sounds. Gastrointestinal: Bowel sounds 4 quadrants. Soft and nontender to palpation. No guarding or rigidity. No palpable masses. No distention.  Right-sided CVA tenderness. Musculoskeletal: Full range of motion to all extremities. No gross deformities appreciated. Neurologic:  Normal speech and language. No gross focal neurologic deficits are appreciated.  Skin:  Skin is warm, dry and intact. No rash noted. Psychiatric: Mood and affect are normal. Speech and behavior are normal. Patient exhibits appropriate insight and judgement.   ____________________________________________   LABS (all labs ordered are listed, but only abnormal results are displayed)  Labs Reviewed  URINALYSIS, COMPLETE (UACMP) WITH MICROSCOPIC - Abnormal; Notable for the following components:      Result Value   Color, Urine YELLOW (*)    APPearance CLOUDY (*)    Hgb urine dipstick LARGE (*)    Protein, ur >=300 (*)    Leukocytes,Ua LARGE (*)    RBC / HPF >50 (*)    WBC, UA >50 (*)    All other components within normal limits  POC URINE PREG, ED    ____________________________________________  EKG   ____________________________________________  RADIOLOGY I personally viewed and evaluated these images as part of my medical decision making, as well as reviewing the written report by the radiologist.  ED Provider Interpretation: Findings consistent with ascending urinary tract infection on the right side.  Punctate stone on the left.  CT Renal Stone Study  Result Date: 05/27/2021 CLINICAL DATA:  30 year old female with flank pain. Concern for kidney stone. EXAM: CT ABDOMEN AND PELVIS WITHOUT CONTRAST TECHNIQUE: Multidetector CT imaging of the abdomen and pelvis was performed following the standard protocol without IV contrast. COMPARISON:  CT abdomen pelvis dated 03/17/2010. FINDINGS: Evaluation of this exam is limited in the absence of intravenous contrast. Lower chest: The visualized lung bases are clear. No intra-abdominal free air or free fluid. Hepatobiliary: Diffuse fatty liver. No intrahepatic biliary dilatation. The gallbladder is unremarkable. Pancreas: Unremarkable. No pancreatic ductal dilatation or  surrounding inflammatory changes. Spleen: Normal in size without focal abnormality. Adrenals/Urinary Tract: The adrenal glands unremarkable. There is mild right hydronephrosis and mild right hydroureter. There is mild right periureteral stranding and haziness. No stone identified. Findings may represent a recently passed stone or UTI. Correlation with urinalysis recommended. There is a punctate nonobstructing left renal upper pole calculus. No hydronephrosis on the left. The left ureter is unremarkable. The urinary bladder is collapsed. Stomach/Bowel: There is no bowel obstruction or active inflammation. The appendix is normal. Vascular/Lymphatic: The abdominal aorta and IVC unremarkable. No portal venous gas. There is no adenopathy. Reproductive: The uterus is anteverted and grossly unremarkable. No adnexal masses. Other: None  Musculoskeletal: No acute or significant osseous findings. IMPRESSION: 1. Mild right hydronephrosis and mild right hydroureter. No stone identified. Findings may represent a recently passed stone or UTI. Correlation with urinalysis recommended. 2. Punctate nonobstructing left renal upper pole calculus. No hydronephrosis on the left. 3. Fatty liver. 4. No bowel obstruction. Normal appendix. Electronically Signed   By: Elgie Collard M.D.   On: 05/27/2021 22:52    ____________________________________________    PROCEDURES  Procedure(s) performed:    Procedures    Medications  cefTRIAXone (ROCEPHIN) injection 1 g (has no administration in time range)     ____________________________________________   INITIAL IMPRESSION / ASSESSMENT AND PLAN / ED COURSE  Pertinent labs & imaging results that were available during my care of the patient were reviewed by me and considered in my medical decision making (see chart for details).  Review of the [place] CSRS was performed in accordance of the [facility] prior to dispensing any controlled drugs.           Patient's diagnosis is consistent with pyonephritis.  Patient presented to the emergency department with right flank pain, urinary frequency.  No dysuria.  No fevers or chills.  Patient denying vaginal bleeding or discharge.  Patient had right-sided CVA tenderness and finding on exam was concerning for stone versus pyelonephritis.  Urine had both hematuria plus white blood cells and I was concerned that the patient did have a UTI as well as possibly having nephrolithiasis.  No personal history but strong familial history of kidney stones.  Patient has had hematuria in the past that has been ongoing and uncertain origin according to the patient.  She seen urology in the past but there was no medication or treatment to be done.  Patient has not seen any hematuria but there was a large amount of hemoglobin.  On review of patient's medical records it does  appear the patient's urinalysis always does have some hemoglobin but given the flank pain, increased hemoglobin from baseline and concern for infection with possible kidney stone CT scan was performed.  No evidence of nephrolithiasis on the right there there was a punctate stone on the left.  Findings were concerning more for a sending urinary tract infection and given her symptoms I will diagnose her with pyelonephritis.  Rocephin is given here in the emergency department.  Keflex outpatient.  Follow-up with primary care as needed.  Return precautions discussed with the patient at this time. Patient is given ED precautions to return to the ED for any worsening or new symptoms.     ____________________________________________  FINAL CLINICAL IMPRESSION(S) / ED DIAGNOSES  Final diagnoses:  Acute pyelonephritis      NEW MEDICATIONS STARTED DURING THIS VISIT:  ED Discharge Orders          Ordered    cephALEXin (KEFLEX) 500  MG capsule  4 times daily        05/27/21 2308                This chart was dictated using voice recognition software/Dragon. Despite best efforts to proofread, errors can occur which can change the meaning. Any change was purely unintentional.    Racheal Patches, PA-C 05/27/21 2312    Merwyn Katos, MD 05/29/21 [phone]

## 2021-05-27 NOTE — ED Triage Notes (Signed)
Pt reports lower back pain and urinary frequency since this am.  No injury to back.   pt taking tylenol without relief.  Pt alert  speech clear

## 2022-01-26 ENCOUNTER — Other Ambulatory Visit: Payer: Self-pay

## 2022-01-26 ENCOUNTER — Emergency Department
Admission: EM | Admit: 2022-01-26 | Discharge: 2022-01-26 | Disposition: A | Discharge status: Home / Self Care | Payer: Self-pay | Attending: Emergency Medicine | Admitting: Emergency Medicine

## 2022-01-26 ENCOUNTER — Emergency Department: Payer: Self-pay

## 2022-01-26 DIAGNOSIS — G44209 Tension-type headache, unspecified, not intractable: Secondary | ICD-10-CM | POA: Insufficient documentation

## 2022-01-26 LAB — BASIC METABOLIC PANEL
Anion gap: 10 (ref 5–15)
BUN: 11 mg/dL (ref 6–20)
CO2: 24 mmol/L (ref 22–32)
Calcium: 9.7 mg/dL (ref 8.9–10.3)
Chloride: 103 mmol/L (ref 98–111)
Creatinine, Ser: 0.88 mg/dL (ref 0.44–1.00)
GFR, Estimated: >60 mL/min (ref >60)
Glucose, Bld: 110 mg/dL — ABNORMAL HIGH (ref 70–99)
Potassium: 4.3 mmol/L (ref 3.5–5.1)
Sodium: 137 mmol/L (ref 135–145)

## 2022-01-26 LAB — CBC
HCT: 43.1 % (ref 36.0–46.0)
Hemoglobin: 13.7 g/dL (ref 12.0–15.0)
MCH: 29 pg (ref 26.0–34.0)
MCHC: 31.8 g/dL (ref 30.0–36.0)
MCV: 91.1 fL (ref 80.0–100.0)
Platelets: 276 10*3/uL (ref 150–400)
RBC: 4.73 MIL/uL (ref 3.87–5.11)
RDW: 13.3 % (ref 11.5–15.5)
WBC: 11.9 10*3/uL — ABNORMAL HIGH (ref 4.0–10.5)
nRBC: 0 % (ref 0.0–0.2)

## 2022-01-26 LAB — POC URINE PREG, ED: Preg Test, Ur: NEGATIVE

## 2022-01-26 MED ORDER — ASPIRIN-ACETAMINOPHEN-CAFFEINE 250-250-65 MG PO TABS
1.0000 | ORAL_TABLET | Freq: Once | ORAL | Status: DC
  Filled 2022-01-26: qty 1

## 2022-01-26 MED ORDER — METHOCARBAMOL 500 MG PO TABS: 500.0000 mg | ORAL_TABLET | Freq: Three times a day (TID) | ORAL | 0 refills | Status: AC | PRN

## 2022-01-26 MED ORDER — BUTALBITAL-APAP-CAFFEINE 50-325-40 MG PO TABS
1.0000 | ORAL_TABLET | Freq: Once | ORAL | Status: AC
  Administered 2022-01-26: 1 via ORAL
  Filled 2022-01-26: qty 1

## 2022-01-26 MED ORDER — EXCEDRIN MIGRAINE 250-250-65 MG PO TABS: 1.0000 | ORAL_TABLET | Freq: Four times a day (QID) | ORAL | 0 refills | Status: AC | PRN

## 2022-01-26 NOTE — ED Provider Notes (Signed)
? ?[facility] ?Provider Note ? ?Patient Contact: 10:03 PM (approximate) ? ? ?History  ? ?Headache ? ? ?HPI ? ?Roberta Bishop is a 31 y.o. female who presents the emergency department complaining of sudden sharp occipital headache.  She states that it feels like a lightening bolt when the headache starts.  She had 2 episodes of this last night, and has had an ongoing headache today.  Patient denies any visual changes, unilateral weakness, difficulty formulating thoughts or words.  Patient has no headache history, no recent head trauma.  The symptoms did improve with ibuprofen though she still has a mild headache currently.  She denies any fevers or chills, URI symptoms, chest pain, shortness of breath, GI symptoms. ?  ? ? ?Physical Exam  ? ?Triage Vital Signs: ?ED Triage Vitals  ?Enc Vitals Group  ?   BP 01/26/22 1921 (!) 126/105  ?   Pulse Rate 01/26/22 1921 95  ?   Resp 01/26/22 1921 20  ?   Temp 01/26/22 1921 98.2 ?F (36.8 ?C)  ?   Temp Source 01/26/22 1921 Oral  ?   SpO2 01/26/22 1921 92 %  ?   Weight 01/26/22 1926 174 lb (78.9 kg)  ?   Height 01/26/22 1926 5\' 2"  (1.575 m)  ?   Head Circumference --   ?   Peak Flow --   ?   Pain Score 01/26/22 1925 3  ?   Pain Loc --   ?   Pain Edu? --   ?   Excl. in GC? --   ? ? ?Most recent vital signs: ?Vitals:  ? 01/26/22 1921 01/26/22 2239  ?BP: (!) 126/105 117/80  ?Pulse: 95 73  ?Resp: 20 17  ?Temp: 98.2 ?F (36.8 ?C)   ?SpO2: 92% 97%  ? ? ? ?General: Alert and in no acute distress. ?Eyes:  PERRL. EOMI. ?Head: No acute traumatic findings  ?Neck: No stridor. No midline cervical spine tenderness to palpation.  Patient has tenderness along the paraspinal and trapezius muscle groups extending from the occipital skull diffusely into the musculature.  This reproduces the patient's symptoms.  There is no evidence of torticollis as patient is able to freely move the cervical spine with flexion, extension, rotation.  Negative for Kernig's and  Brudzinski's. ?Hematological/Lymphatic/Immunilogical: No cervical lymphadenopathy. ?Cardiovascular:  Good peripheral perfusion ?Respiratory: Normal respiratory effort without tachypnea or retractions. Lungs CTAB. Good air entry to the bases with no decreased or absent breath sounds. ?Musculoskeletal: Full range of motion to all extremities.  ?Neurologic:  No gross focal neurologic deficits are appreciated.  Cranial nerves II through XII grossly intact.  Negative Romberg's and pronator drift.  Equal grip strength in the upper extremities. ?Skin:   No rash noted ?Other: ? ? ?ED Results / Procedures / Treatments  ? ?Labs ?(all labs ordered are listed, but only abnormal results are displayed) ?Labs Reviewed  ?BASIC METABOLIC PANEL - Abnormal; Notable for the following components:  ?    Result Value  ? Glucose, Bld 110 (*)   ? All other components within normal limits  ?CBC - Abnormal; Notable for the following components:  ? WBC 11.9 (*)   ? All other components within normal limits  ?POC URINE PREG, ED  ? ? ? ?EKG ? ? ? ? ?RADIOLOGY ? ?I personally viewed and evaluated these images as part of my medical decision making, as well as reviewing the written report by the radiologist. ? ?ED Provider Interpretation: No acute intracranial  abnormality on CT scan ? ?CT Head Wo Contrast ? ?Result Date: 01/26/2022 ?CLINICAL DATA:  Sudden onset headache yesterday, sharp intermittent pain radiating to top of head EXAM: CT HEAD WITHOUT CONTRAST TECHNIQUE: Contiguous axial images were obtained from the base of the skull through the vertex without intravenous contrast. RADIATION DOSE REDUCTION: This exam was performed according to the departmental dose-optimization program which includes automated exposure control, adjustment of the mA and/or kV according to patient size and/or use of iterative reconstruction technique. COMPARISON:  05/17/2009 FINDINGS: Brain: No acute infarct or hemorrhage. Lateral ventricles and midline structures are  unremarkable. No acute extra-axial fluid collections. No mass effect. Vascular: No hyperdense vessel or unexpected calcification. Skull: Normal. Negative for fracture or focal lesion. Sinuses/Orbits: No acute finding. Other: None. IMPRESSION: 1. No acute intracranial process. Electronically Signed   By: Sharlet Salina M.D.   On: 01/26/2022 22:16   ? ?PROCEDURES: ? ?Critical Care performed: No ? ?Procedures ? ? ?MEDICATIONS ORDERED IN ED: ?Medications  ?aspirin-acetaminophen-caffeine (EXCEDRIN MIGRAINE) per tablet 1 tablet (has no administration in time range)  ? ? ? ?IMPRESSION / MDM / ASSESSMENT AND PLAN / ED COURSE  ?I reviewed the triage vital signs and the nursing notes. ?             ?               ? ?Differential diagnosis includes, but is not limited to, tension type headache, migraine, intracranial hemorrhage, cervical radiculopathy ? ? ?Patient's diagnosis is consistent with acute headache, likely tension type.  Patient presents the emergency department complaining of occipital headache.  No trauma recently, no other associated symptoms with unilateral weakness, difficulty formulating thoughts or words.  Patient had a reassuring exam in regards to neuro exam with no deficits.  She did have some tenderness in the left paraspinal and trapezius muscle groups which does reproduce the patient's symptoms.  However given the sudden onset of severe headache that she describes as sharp like somebody is "chiseling" at her brain I did order imaging to ensure no evidence of hemorrhage.  CT scan was reassuring.  Patient is not able to secure a driver so I will not provide a migraine cocktail though I will provide medications orally for the patient.  Prescriptions will be provided to the patient for additional headache control.  Concerning signs and symptoms are discussed with the patient.  Otherwise follow-up primary care as needed..  Patient is given ED precautions to return to the ED for any worsening or new  symptoms. ? ? ? ?  ? ? ?FINAL CLINICAL IMPRESSION(S) / ED DIAGNOSES  ? ?Final diagnoses:  ?Acute non intractable tension-type headache  ? ? ? ?Rx / DC Orders  ? ?ED Discharge Orders   ? ?      Ordered  ?  aspirin-acetaminophen-caffeine (EXCEDRIN MIGRAINE) 250-250-65 MG tablet  Every 6 hours PRN       ? 01/26/22 2259  ?  methocarbamol (ROBAXIN) 500 MG tablet  Every 8 hours PRN       ? 01/26/22 2259  ? ?  ?  ? ?  ? ? ? ?Note:  This document was prepared using Dragon voice recognition software and may include unintentional dictation errors. ?  ?Racheal Patches, PA-C ?01/26/22 2259 ? ?  ?Delton Prairie, MD ?01/27/22 2145 ? ?

## 2022-01-26 NOTE — ED Triage Notes (Signed)
Sudden onset headache last night. Pain started at the base of neck and then radiated to top of head. Sharp, intermittent. Denies injury. Denies any neuro deficits, and none present in triage.  ?

## 2023-01-10 ENCOUNTER — Encounter: Payer: Self-pay | Admitting: Emergency Medicine

## 2023-01-10 ENCOUNTER — Other Ambulatory Visit: Payer: Self-pay

## 2023-01-10 ENCOUNTER — Emergency Department: Payer: Self-pay

## 2023-01-10 ENCOUNTER — Emergency Department
Admission: EM | Admit: 2023-01-10 | Discharge: 2023-01-10 | Disposition: A | Discharge status: Home / Self Care | Payer: Self-pay | Attending: Emergency Medicine | Admitting: Emergency Medicine

## 2023-01-10 DIAGNOSIS — R102 Pelvic and perineal pain: Secondary | ICD-10-CM | POA: Insufficient documentation

## 2023-01-10 DIAGNOSIS — N939 Abnormal uterine and vaginal bleeding, unspecified: Secondary | ICD-10-CM | POA: Insufficient documentation

## 2023-01-10 DIAGNOSIS — R110 Nausea: Secondary | ICD-10-CM | POA: Insufficient documentation

## 2023-01-10 DIAGNOSIS — D72829 Elevated white blood cell count, unspecified: Secondary | ICD-10-CM | POA: Insufficient documentation

## 2023-01-10 DIAGNOSIS — R9389 Abnormal findings on diagnostic imaging of other specified body structures: Secondary | ICD-10-CM

## 2023-01-10 DIAGNOSIS — N8500 Endometrial hyperplasia, unspecified: Secondary | ICD-10-CM | POA: Insufficient documentation

## 2023-01-10 DIAGNOSIS — R1030 Lower abdominal pain, unspecified: Secondary | ICD-10-CM | POA: Insufficient documentation

## 2023-01-10 LAB — CBC
HCT: 41.8 % (ref 36.0–46.0)
Hemoglobin: 13.8 g/dL (ref 12.0–15.0)
MCH: 29.9 pg (ref 26.0–34.0)
MCHC: 33 g/dL (ref 30.0–36.0)
MCV: 90.7 fL (ref 80.0–100.0)
Platelets: 324 10*3/uL (ref 150–400)
RBC: 4.61 MIL/uL (ref 3.87–5.11)
RDW: 12.8 % (ref 11.5–15.5)
WBC: 11.2 10*3/uL — ABNORMAL HIGH (ref 4.0–10.5)
nRBC: 0 % (ref 0.0–0.2)

## 2023-01-10 LAB — HEPATIC FUNCTION PANEL
ALT: 47 U/L — ABNORMAL HIGH (ref 0–44)
AST: 38 U/L (ref 15–41)
Albumin: 4.1 g/dL (ref 3.5–5.0)
Alkaline Phosphatase: 104 U/L (ref 38–126)
Bilirubin, Direct: <0.1 mg/dL (ref 0.0–0.2)
Total Bilirubin: 0.4 mg/dL (ref 0.3–1.2)
Total Protein: 7.9 g/dL (ref 6.5–8.1)

## 2023-01-10 LAB — URINALYSIS, ROUTINE W REFLEX MICROSCOPIC
Bilirubin Urine: NEGATIVE
Glucose, UA: NEGATIVE mg/dL
Ketones, ur: NEGATIVE mg/dL
Leukocytes,Ua: NEGATIVE
Nitrite: NEGATIVE
Protein, ur: 100 mg/dL — AB
RBC / HPF: >50 RBC/hpf (ref 0–5)
Specific Gravity, Urine: 1.026 (ref 1.005–1.030)
pH: 5 (ref 5.0–8.0)

## 2023-01-10 LAB — BASIC METABOLIC PANEL
Anion gap: 10 (ref 5–15)
BUN: 12 mg/dL (ref 6–20)
CO2: 21 mmol/L — ABNORMAL LOW (ref 22–32)
Calcium: 9.1 mg/dL (ref 8.9–10.3)
Chloride: 104 mmol/L (ref 98–111)
Creatinine, Ser: 0.73 mg/dL (ref 0.44–1.00)
GFR, Estimated: >60 mL/min (ref >60)
Glucose, Bld: 156 mg/dL — ABNORMAL HIGH (ref 70–99)
Potassium: 4 mmol/L (ref 3.5–5.1)
Sodium: 135 mmol/L (ref 135–145)

## 2023-01-10 LAB — LIPASE, BLOOD: Lipase: 40 U/L (ref 11–51)

## 2023-01-10 LAB — POC URINE PREG, ED: Preg Test, Ur: NEGATIVE

## 2023-01-10 LAB — HCG, QUANTITATIVE, PREGNANCY: hCG, Beta Chain, Quant, S: 1 m[IU]/mL (ref <5)

## 2023-01-10 LAB — PREGNANCY, URINE: Preg Test, Ur: NEGATIVE

## 2023-01-10 MED ORDER — MEDROXYPROGESTERONE ACETATE 10 MG PO TABS: 10.0000 mg | ORAL_TABLET | Freq: Every day | ORAL | 0 refills | Status: DC

## 2023-01-10 NOTE — ED Notes (Signed)
Pt back in room.

## 2023-01-10 NOTE — Discharge Instructions (Addendum)
I discussed your your ultrasound with Dr. Marcelline Mates who recommended 10 days of Provera to try to help regulate the lining in your uterus.  You should call them to make a follow-up appointment to discuss this further.  If you are still open to having children there is an option of doing this every 3 months to help keep the lining more regular but you can discuss this further with somebody from their office.  Return to the ER if develop worsening bleeding more than a pad an hour fevers or any other concerns  IMPRESSION: 1. Endometrial thickness of 15 mm with numerous cystic areas in the endometrium. This could be due to cystic endometrial hyperplasia. If bleeding remains unresponsive to hormonal or medical therapy, sonohysterogram should be considered for focal lesion work-up. (Ref: Radiological Reasoning: Algorithmic Workup of Abnormal Vaginal Bleeding with Endovaginal Sonography and Sonohysterography. AJR 2008; [id number]) 2. Normal appearance of the ovaries. No evidence of ovarian torsion.

## 2023-01-10 NOTE — ED Notes (Signed)
UA collected prior to RN engagement

## 2023-01-10 NOTE — ED Provider Notes (Signed)
[facility] Provider Note    Event Date/Time   First MD Initiated Contact with Patient 01/10/23 0040     (approximate)   History   Vaginal Bleeding   HPI  Roberta Bishop is a 32 y.o. female with history of irregular periods who comes in with concern for vaginal bleeding.  Patient reports that she had a period 2 weeks ago.  She reports having another period started today that initially was light around 1 PM but then a few hours ago she started to get a lot heavier and she soaked through a pad within an hour and had a large clot that came out which got her worried.  She did report some lower abdominal cramping and nausea.  She states that she has since had her bleeding slowed down to her normal amount.  She denies any concerns for STDs.  She reports being with 1 partner over the past 5 years.  She has had 4 prior pregnancies 1 miscarriage 3 live births.  She reports that she has been on birth control previously to help regulate her periods but she states that she has not been on it currently given they are open to having more children.  Physical Exam   Triage Vital Signs: ED Triage Vitals  Enc Vitals Group     BP 01/10/23 0023 (!) 136/100     Pulse Rate 01/10/23 0023 99     Resp 01/10/23 0023 16     Temp 01/10/23 0023 98.3 F (36.8 C)     Temp Source 01/10/23 0023 Oral     SpO2 01/10/23 0023 97 %     Weight 01/10/23 0026 185 lb (83.9 kg)     Height 01/10/23 0026 '5\' 2"'$  (1.575 m)     Head Circumference --      Peak Flow --      Pain Score 01/10/23 0026 2     Pain Loc --      Pain Edu? --      Excl. in [place]? --     Most recent vital signs: Vitals:   01/10/23 0023  BP: (!) 136/100  Pulse: 99  Resp: 16  Temp: 98.3 F (36.8 C)  SpO2: 97%     General: Awake, no distress.  CV:  Good peripheral perfusion.  Resp:  Normal effort.  Abd:  No distention.  Soft and nontender Other:  Decline pelvic    ED Results / Procedures / Treatments    Labs (all labs ordered are listed, but only abnormal results are displayed) Labs Reviewed  PREGNANCY, URINE  CBC  BASIC METABOLIC PANEL  URINALYSIS, ROUTINE W REFLEX MICROSCOPIC  HCG, QUANTITATIVE, PREGNANCY  HEPATIC FUNCTION PANEL  LIPASE, BLOOD  POC URINE PREG, ED      RADIOLOGY I have reviewed the ultrasound personally interpreted no evidence of ovarian torsion  PROCEDURES:  Critical Care performed: No  Procedures   MEDICATIONS ORDERED IN ED: Medications - No data to display   IMPRESSION / MDM / [place] / ED COURSE  I reviewed the triage vital signs and the nursing notes.   Patient's presentation is most consistent with acute presentation with potential threat to life or bodily function.   Patient comes in with vaginal bleeding labs ordered evaluate for anemia, pregnancy, ultrasound to rule out any torsion, fibroids etc  Pregnancy test negative.  CBC shows slightly elevated white count but hemoglobin is stable.  BMP reassuring hCG negative hepatic function reassuring lipase normal.  IMPRESSION: 1. Endometrial thickness of 15 mm with numerous cystic areas in the endometrium. This could be due to cystic endometrial hyperplasia. If bleeding remains unresponsive to hormonal or medical therapy, sonohysterogram should be considered for focal lesion work-up. (Ref: Radiological Reasoning: Algorithmic Workup of Abnormal Vaginal Bleeding with Endovaginal Sonography and Sonohysterography. AJR 2008; [id number]) 2. Normal appearance of the ovaries. No evidence of ovarian torsion.    Offered to do pelvic exam but patient declined any STD testing stating that she has been with the same partner for over 5 years patient also states that the bleeding has significantly reduced and would like to try to stay off hormonal therapy if possible.  Discussed with patient the need to follow-up with OB/GYN to have this further worked up to make sure that there was no signs  of any cancer however that this could just be benign and we again discussed hormonal or medical therapy but she would like to try to hold off.  She reports that the bleeding has significantly reduced.  She does not have an OB/GYN she will follow-up with.  I will discuss the case with our on-call OB/GYN. PT reports bleeding now is back to less then normal period.   Discussed with Dr. Marcelline Mates from [facility] does recommend Provera 10 days 10 mg once daily.  She states that this will help with the lining to help increase the chances of getting pregnant in the future.  She reports that sometimes he actually do this every 3 months to help regulate the lining and does not have to have then the long-term OCPs.  Discussed this with patient she felt comfortable with this plan and will follow-up outpatient with OB/GYN       FINAL CLINICAL IMPRESSION(S) / ED DIAGNOSES   Final diagnoses:  Endometrial thickening on ultrasound  Vaginal bleeding     Rx / DC Orders   ED Discharge Orders          Ordered    medroxyPROGESTERone (PROVERA) 10 MG tablet  Daily        01/10/23 0425             Note:  This document was prepared using Dragon voice recognition software and may include unintentional dictation errors.   Vanessa Duck, MD 01/10/23 [phone]

## 2023-01-10 NOTE — ED Triage Notes (Signed)
Pt arrived via POV with reports of vaginal bleeding, pt states she had passed a large clot tonight while she was showering, pt states she has had some abdominal cramping as well and nausea states she had a period 2 weeks ago, but states she has had irregular periods before.

## 2023-06-22 ENCOUNTER — Ambulatory Visit
Admission: EM | Admit: 2023-06-22 | Discharge: 2023-06-22 | Disposition: A | Discharge status: Home / Self Care | Payer: Self-pay | Attending: Family Medicine | Admitting: Family Medicine

## 2023-06-22 DIAGNOSIS — R030 Elevated blood-pressure reading, without diagnosis of hypertension: Secondary | ICD-10-CM

## 2023-06-22 DIAGNOSIS — A084 Viral intestinal infection, unspecified: Secondary | ICD-10-CM

## 2023-06-22 DIAGNOSIS — R21 Rash and other nonspecific skin eruption: Secondary | ICD-10-CM

## 2023-06-22 DIAGNOSIS — R519 Headache, unspecified: Secondary | ICD-10-CM

## 2023-06-22 MED ORDER — CLOTRIMAZOLE-BETAMETHASONE 1-0.05 % EX CREA: TOPICAL_CREAM | CUTANEOUS | 0 refills | Status: AC

## 2023-06-22 MED ORDER — IBUPROFEN 600 MG PO TABS: 600.0000 mg | ORAL_TABLET | Freq: Four times a day (QID) | ORAL | 0 refills | Status: DC

## 2023-06-22 MED ORDER — ONDANSETRON 4 MG PO TBDP: 4.0000 mg | ORAL_TABLET | Freq: Three times a day (TID) | ORAL | 0 refills | Status: DC | PRN

## 2023-06-22 MED ORDER — ONDANSETRON 4 MG PO TBDP
4.0000 mg | ORAL_TABLET | Freq: Once | ORAL | Status: AC
  Administered 2023-06-22: 4 mg via ORAL

## 2023-06-22 NOTE — ED Triage Notes (Signed)
Sx nausea,headache.emesis. x 1 day days. Also concerned about a spot on her forehead. X 2 weeks.

## 2023-06-22 NOTE — Discharge Instructions (Addendum)
Rash: apply ointment as prescribed   Headache: drink plenty of fluids to prevent dehydration.  Take ibuprofen AND Tylenol as needed for pain.    Vomiting: Take Zofran as prescribed.  Your blood pressure is elevated. Take your blood pressure daily and follow up with your primary care doctor in 2 weeks.   Go to ED for red flag symptoms, including; fevers you cannot reduce with Tylenol/Motrin, severe headaches, vision changes, numbness/weakness in part of the body, lethargy, confusion, intractable vomiting, severe dehydration, chest pain, breathing difficulty, severe persistent abdominal or pelvic pain, signs of severe infection (increased redness, swelling of an area), feeling faint or passing out, dizziness, etc. You should especially go to the ED for sudden acute worsening of condition if you do not elect to go at this time.

## 2023-06-22 NOTE — ED Provider Notes (Signed)
[facility]    CSN: [id number] Arrival date & time: 06/22/23  1628      History   Chief Complaint No chief complaint on file.   HPI Roberta Bishop is a 32 y.o. female.   HPI   Roberta Bishop presents for frontal headache with nausea. Started vomiting at work. Has history of tension headaches but this doesn't feel like that. Took Tylenol and Motrin for pain. Pain radiated 3/10 but at worse 10/10.    She went to sleep but still woke up with the headache. She has diarrhea yesterday but none today. She drinks a lot of water. No change to urine color. Several people have been out for the stomach bug.   Has spot on her forehead. She thought it was her shampoo so she changed her shampoo. No rash anywhere else.  No fever, neck pain, cough, rhinorrhea or joint pain.  Endorses some nasal congestion.      Past Medical History:  Diagnosis Date   Anxiety    No prenatal care in current pregnancy     Patient Active Problem List   Diagnosis Date Noted   Previous cesarean delivery affecting pregnancy, antepartum 12/05/2017   Gonorrhea in female 10/21/2016   Chlamydia 10/21/2016   Indication for care in labor or delivery 10/20/2016   No prenatal care in current pregnancy 10/20/2016   History of cesarean delivery, antepartum 10/20/2016   Supervision of high-risk pregnancy, third trimester 10/20/2016   Iron deficiency anemia 10/20/2016   Indication for care in labor and delivery, antepartum 09/13/2016   Female pelvic pain 06/10/2016    Past Surgical History:  Procedure Laterality Date   CESAREAN SECTION  09/24/2013   CESAREAN SECTION N/A 10/20/2016   Procedure: CESAREAN SECTION;  Surgeon: Conard Novak, MD;  Location: [facility];  Service: Obstetrics;  Laterality: N/A;   CESAREAN SECTION N/A 12/05/2017   Procedure: REPEAT CESAREAN SECTION;  Surgeon: Ward, Elenora Fender, MD;  Location: [facility];  Service: Obstetrics;  Laterality: N/A;  FEMALE @ 1910    OB History     Gravida   3   Para  2   Term  2   Preterm      AB      Living  2      SAB      IAB      Ectopic      Multiple  0   Live Births  2            Home Medications    Prior to Admission medications   Medication Sig Start Date End Date Taking? Authorizing Provider  clotrimazole-betamethasone (LOTRISONE) cream Apply to affected area 2 times daily prn 06/22/23  Yes Bobie Kistler, DO  ondansetron (ZOFRAN-ODT) 4 MG disintegrating tablet Take 1 tablet (4 mg total) by mouth every 8 (eight) hours as needed. 06/22/23  Yes Katha Cabal, DO  aspirin-acetaminophen-caffeine (EXCEDRIN MIGRAINE) [phone] MG tablet Take 1 tablet by mouth every 6 (six) hours as needed for headache. 01/26/22   Cuthriell, Delorise Royals, PA-C  ciprofloxacin (CIPRO) 500 MG tablet Take 1 tablet (500 mg total) by mouth 2 (two) times daily. 08/30/18   Governor Rooks, MD  ibuprofen (ADVIL) 600 MG tablet Take 1 tablet (600 mg total) by mouth every 6 (six) hours. 06/22/23   Katha Cabal, DO  medroxyPROGESTERone (PROVERA) 10 MG tablet Take 1 tablet (10 mg total) by mouth daily for 10 days. 01/10/23 01/20/23  Concha Se, MD  methocarbamol (ROBAXIN) 500  MG tablet Take 1 tablet (500 mg total) by mouth every 8 (eight) hours as needed (Neck pain). 01/26/22   Cuthriell, Delorise Royals, PA-C  oxyCODONE (OXY IR/ROXICODONE) 5 MG immediate release tablet Take 1 tablet (5 mg total) by mouth every 4 (four) hours as needed (pain scale 4-7). 12/08/17   Ward, Elenora Fender, MD  Prenatal Vit-Fe Fumarate-FA (MULTIVITAMIN-PRENATAL) 27-0.8 MG TABS tablet Take 1 tablet by mouth daily at 12 noon.    [provider]    Family History History reviewed. No pertinent family history.  Social History Social History   Tobacco Use   Smoking status: Every Day    Current packs/day: 0.00    Types: Cigarettes    Last attempt to quit: 02/10/2015    Years since quitting: 8.3   Smokeless tobacco: Never  Vaping Use   Vaping status: Never Used   Substance Use Topics   Alcohol use: No   Drug use: No     Allergies   Morphine and codeine and Morphine   Review of Systems Review of Systems: negative unless otherwise stated in HPI.      Physical Exam Triage Vital Signs ED Triage Vitals  Encounter Vitals Group     BP 06/22/23 1638 (!) 154/109     Systolic BP Percentile --      Diastolic BP Percentile --      Pulse Rate 06/22/23 1638 84     Resp 06/22/23 1638 17     Temp 06/22/23 1638 98.5 F (36.9 C)     Temp Source 06/22/23 1638 Oral     SpO2 06/22/23 1638 98 %     Weight 06/22/23 1637 187 lb (84.8 kg)     Height --      Head Circumference --      Peak Flow --      Pain Score --      Pain Loc --      Pain Education --      Exclude from Growth Chart --    No data found.  Updated Vital Signs BP (!) 154/109 (BP Location: Right Arm)   Pulse 84   Temp 98.5 F (36.9 C) (Oral)   Resp 17   Wt 84.8 kg   SpO2 98%   BMI 34.20 kg/m   Visual Acuity Right Eye Distance:   Left Eye Distance:   Bilateral Distance:    Right Eye Near:   Left Eye Near:    Bilateral Near:     Physical Exam GEN:     alert, well appearing and no distress    HENT:  mucus membranes moist,  no nasal discharge  EYES:   pupils equal and reactive, no scleral icterus  NECK:  supple, normal ROM RESP:  clear to auscultation bilaterally, no increased work of breathing  CVS:   regular rate and rhythm ABD:  soft, non-tender in all quadrants; bowel sounds present; no palpable masses,   EXT:   normal ROM, atraumatic, no edema  NEURO:  CN 2-12 intact, strength at baseline,  speech normal, alert and oriented   Skin:   warm and dry, slightly erythematous irregular lesion on superior forehead at midline, scaly, no drainage or central clearing appreciated        UC Treatments / Results  Labs (all labs ordered are listed, but only abnormal results are displayed) Labs Reviewed - No data to display  EKG  If EKG performed, see my  interpretation in the MDM section  Radiology No  results found.   Procedures Procedures (including critical care time)  Medications Ordered in UC Medications  ondansetron (ZOFRAN-ODT) disintegrating tablet 4 mg (4 mg Oral Given 06/22/23 1655)    Initial Impression / Assessment and Plan / UC Course  I have reviewed the triage vital signs and the nursing notes.  Pertinent labs & imaging results that were available during my care of the patient were reviewed by me and considered in my medical decision making (see chart for details).       Patient is a 32 y.o. female  who presents for GI symptoms and headache.  Overall patient is nontoxic-appearing and afebrile.  Vital signs stable.  PO Zofran given for nausea. Declines blood work as she has a fear of needles and doesn't want to pass out. Declined IM Toradol for headache.   Pt able to tolerate PO.  Prescribed Zofran for nausea and vomiting. Headache may be related to volume losses. Encouraged increased po intake.  Neruo and abdominal exam are unremarkable. Motrin 600 mg for pain.   For forehead rash: unsure if this is eczema vs fungal. Treat with Lotrisone.   She is hypertensive. Cardiopulmonary exam is unremarkable. Pt to follow up with PCP for BP recheck in the next 2 weeks.    ED and return precautions given and patient/guardian voiced understanding. Discussed MDM, treatment plan and plan for follow-up with patient who agrees with plan. Work note provided.     Final Clinical Impressions(s) / UC Diagnoses   Final diagnoses:  Viral gastroenteritis  Bad headache  Rash of face  Elevated blood pressure reading in office without diagnosis of hypertension     Discharge Instructions      Rash: apply ointment as prescribed   Headache: drink plenty of fluids to prevent dehydration.  Take ibuprofen AND Tylenol as needed for pain.    Vomiting: Take Zofran as prescribed.  Your blood pressure is elevated. Take your blood pressure  daily and follow up with your primary care doctor in 2 weeks.   Go to ED for red flag symptoms, including; fevers you cannot reduce with Tylenol/Motrin, severe headaches, vision changes, numbness/weakness in part of the body, lethargy, confusion, intractable vomiting, severe dehydration, chest pain, breathing difficulty, severe persistent abdominal or pelvic pain, signs of severe infection (increased redness, swelling of an area), feeling faint or passing out, dizziness, etc. You should especially go to the ED for sudden acute worsening of condition if you do not elect to go at this time.       ED Prescriptions     Medication Sig Dispense Auth. Provider   ondansetron (ZOFRAN-ODT) 4 MG disintegrating tablet Take 1 tablet (4 mg total) by mouth every 8 (eight) hours as needed. 20 tablet Roberta Pilger, DO   clotrimazole-betamethasone (LOTRISONE) cream Apply to affected area 2 times daily prn 15 g Roberta Tahir, DO   ibuprofen (ADVIL) 600 MG tablet Take 1 tablet (600 mg total) by mouth every 6 (six) hours. 30 tablet Katha Cabal, DO      PDMP not reviewed this encounter.   Katha Cabal, DO 06/22/23 1741

## 2023-08-03 ENCOUNTER — Other Ambulatory Visit: Payer: Self-pay

## 2023-08-03 ENCOUNTER — Emergency Department: Payer: BLUE CROSS/BLUE SHIELD

## 2023-08-03 ENCOUNTER — Emergency Department
Admission: EM | Admit: 2023-08-03 | Discharge: 2023-08-03 | Disposition: A | Discharge status: Home / Self Care | Payer: BLUE CROSS/BLUE SHIELD | Attending: Student in an Organized Health Care Education/Training Program | Admitting: Student in an Organized Health Care Education/Training Program

## 2023-08-03 ENCOUNTER — Encounter: Payer: Self-pay | Admitting: Emergency Medicine

## 2023-08-03 DIAGNOSIS — R102 Pelvic and perineal pain: Secondary | ICD-10-CM | POA: Diagnosis not present

## 2023-08-03 DIAGNOSIS — D649 Anemia, unspecified: Secondary | ICD-10-CM | POA: Insufficient documentation

## 2023-08-03 DIAGNOSIS — N939 Abnormal uterine and vaginal bleeding, unspecified: Secondary | ICD-10-CM | POA: Insufficient documentation

## 2023-08-03 LAB — CBC
HCT: 36.8 % (ref 36.0–46.0)
Hemoglobin: 11.6 g/dL — ABNORMAL LOW (ref 12.0–15.0)
MCH: 26.5 pg (ref 26.0–34.0)
MCHC: 31.5 g/dL (ref 30.0–36.0)
MCV: 84.2 fL (ref 80.0–100.0)
Platelets: 367 10*3/uL (ref 150–400)
RBC: 4.37 MIL/uL (ref 3.87–5.11)
RDW: 15.1 % (ref 11.5–15.5)
WBC: 7.9 10*3/uL (ref 4.0–10.5)
nRBC: 0 % (ref 0.0–0.2)

## 2023-08-03 LAB — BASIC METABOLIC PANEL
Anion gap: 9 (ref 5–15)
BUN: 14 mg/dL (ref 6–20)
CO2: 23 mmol/L (ref 22–32)
Calcium: 8.9 mg/dL (ref 8.9–10.3)
Chloride: 104 mmol/L (ref 98–111)
Creatinine, Ser: 0.68 mg/dL (ref 0.44–1.00)
GFR, Estimated: >60 mL/min (ref >60)
Glucose, Bld: 118 mg/dL — ABNORMAL HIGH (ref 70–99)
Potassium: 4.1 mmol/L (ref 3.5–5.1)
Sodium: 136 mmol/L (ref 135–145)

## 2023-08-03 LAB — TYPE AND SCREEN
ABO/RH(D): O POS
Antibody Screen: NEGATIVE

## 2023-08-03 LAB — POC URINE PREG, ED: Preg Test, Ur: NEGATIVE

## 2023-08-03 LAB — HCG, QUANTITATIVE, PREGNANCY: hCG, Beta Chain, Quant, S: <1 m[IU]/mL (ref <5)

## 2023-08-03 MED ORDER — SODIUM CHLORIDE 0.9 % IV BOLUS
500.0000 mL | Freq: Once | INTRAVENOUS | Status: AC
  Administered 2023-08-03: 500 mL via INTRAVENOUS

## 2023-08-03 MED ORDER — ONDANSETRON 4 MG PO TBDP: 4.0000 mg | ORAL_TABLET | Freq: Three times a day (TID) | ORAL | 0 refills | Status: AC | PRN

## 2023-08-03 MED ORDER — ONDANSETRON HCL 4 MG/2ML IJ SOLN
4.0000 mg | Freq: Once | INTRAMUSCULAR | Status: AC
  Administered 2023-08-03: 4 mg via INTRAVENOUS
  Filled 2023-08-03: qty 2

## 2023-08-03 MED ORDER — MEDROXYPROGESTERONE ACETATE 10 MG PO TABS: 10.0000 mg | ORAL_TABLET | Freq: Every day | ORAL | 0 refills | Status: AC

## 2023-08-03 MED ORDER — IBUPROFEN 600 MG PO TABS: 600.0000 mg | ORAL_TABLET | Freq: Four times a day (QID) | ORAL | 0 refills | Status: AC | PRN

## 2023-08-03 MED ORDER — FENTANYL CITRATE PF 50 MCG/ML IJ SOSY
50.0000 ug | PREFILLED_SYRINGE | INTRAMUSCULAR | Status: DC | PRN
  Filled 2023-08-03: qty 1

## 2023-08-03 NOTE — ED Notes (Signed)
Pt syncopized after blood draw while in triage. Regained consciousness after about 1 min. Now lying supine in recliner, roomed at this time

## 2023-08-03 NOTE — ED Triage Notes (Addendum)
Pt in with heavy vaginal bleeding, states she started her period yesterday. Hx of irregular periods, last month lasting 2 wks. Pt states she has been passing large clots and has been saturating through 4 pads since this morning. Denies any sob or dizziness

## 2023-08-03 NOTE — Discharge Instructions (Signed)
Start taking the Provera as prescribed.  Take this for 10 days.  At the end of the cycle, you will have a menstrual-like.  Which should resolve over several days.  Take the Advil and Zofran.  I provided the number to to local OB/GYN's.  I would recommend seeing them for follow-up and discussion of additional regimens to decrease the likelihood of this happening again.    Drink plenty of fluids.

## 2023-08-03 NOTE — ED Provider Notes (Signed)
Assumed care at 4 PM. Briefly, pt is 32 yo F here with vaginal bleeding, likely DUB. Hgb is 11.6. Pt is HDS. Will f/u TVUS and dispo.  Ultrasound shows thickened endometrial stripe.  Otherwise unremarkable.  Patient is hemodynamically stable.  Hemoglobin is near baseline.  Will discharge with Provera and outpatient follow-up.   Shaune Pollack, MD 08/03/23 [phone]

## 2023-08-03 NOTE — ED Notes (Addendum)
First Nurse:  NT called this RN to triage room, pt was pale and diaphoretic and BP decreased to 80 systolic. Pt regained consciousness after approx 1 min. Pt placed in room 16 at this time. Pt is now alert but some disorientation.

## 2023-08-03 NOTE — ED Provider Notes (Signed)
[facility] Provider Note    Event Date/Time   First MD Initiated Contact with Patient 08/03/23 1341     (approximate)   History   Vaginal Bleeding   HPI  Roberta Bishop is a 32 y.o. female  G3P2002 with a history of PCOS.  She presents to the ER for evaluation of heavy vaginal bleeding as well as pelvic pain starting last night.  Menstrual cycle started to left bleed 48 hours ago.  Having heavy bleeding saturating couple pads per hour over the past few hours.  Feels like the bleeding has subsided but was feeling worsening pain and like she is about to pass out so she came to the ER.  She is not on any blood thinners.  Denies any dysuria.  Does not think that she is pregnant but is sexually active.     Physical Exam   Triage Vital Signs: ED Triage Vitals  Encounter Vitals Group     BP 08/03/23 1317 (!) 155/110     Systolic BP Percentile --      Diastolic BP Percentile --      Pulse Rate 08/03/23 1317 84     Resp 08/03/23 1317 18     Temp 08/03/23 1320 98.8 F (37.1 C)     Temp Source 08/03/23 1320 Oral     SpO2 08/03/23 1317 100 %     Weight 08/03/23 1317 186 lb 15.2 oz (84.8 kg)     Height --      Head Circumference --      Peak Flow --      Pain Score 08/03/23 1317 4     Pain Loc --      Pain Education --      Exclude from Growth Chart --     Most recent vital signs: Vitals:   08/03/23 1320 08/03/23 1430  BP:  (!) 124/94  Pulse:  68  Resp:  15  Temp: 98.8 F (37.1 C)   SpO2:  99%     Constitutional: Alert  Eyes: Conjunctivae are normal.  Head: Atraumatic. Nose: No congestion/rhinnorhea. Mouth/Throat: Mucous membranes are moist.   Neck: Painless ROM.  Cardiovascular:   Good peripheral circulation. Respiratory: Normal respiratory effort.  No retractions.  Gastrointestinal: With mild suprapubic tenderness palpation. Musculoskeletal:  no deformity Neurologic:  MAE spontaneously. No gross focal neurologic deficits are  appreciated.  Skin:  Skin is warm, dry and intact. No rash noted. Psychiatric: Mood and affect are normal. Speech and behavior are normal.    ED Results / Procedures / Treatments   Labs (all labs ordered are listed, but only abnormal results are displayed) Labs Reviewed  CBC - Abnormal; Notable for the following components:      Result Value   Hemoglobin 11.6 (*)    All other components within normal limits  BASIC METABOLIC PANEL - Abnormal; Notable for the following components:   Glucose, Bld 118 (*)    All other components within normal limits  HCG, QUANTITATIVE, PREGNANCY  POC URINE PREG, ED  TYPE AND SCREEN     EKG     RADIOLOGY  Please see ED Course for my review and interpretation.  I personally reviewed all radiographic images ordered to evaluate for the above acute complaints and reviewed radiology reports and findings.  These findings were personally discussed with the patient.  Please see medical record for radiology report.    PROCEDURES:  Critical Care performed: No  Procedures   MEDICATIONS  ORDERED IN ED: Medications  fentaNYL (SUBLIMAZE) injection 50 mcg (has no administration in time range)  ondansetron (ZOFRAN) injection 4 mg (4 mg Intravenous Given 08/03/23 1359)  sodium chloride 0.9 % bolus 500 mL (0 mLs Intravenous Stopped 08/03/23 1528)     IMPRESSION / MDM / ASSESSMENT AND PLAN / ED COURSE  I reviewed the triage vital signs and the nursing notes.                              Differential diagnosis includes, but is not limited to, ectopic, AUB, menometrorrhagia, UTI, cyst, miscarriage  Patient presenting to the ER for evaluation of symptoms as described above.  Based on symptoms, risk factors and considered above differential, this presenting complaint could reflect a potentially life-threatening illness therefore the patient will be placed on continuous pulse oximetry and telemetry for monitoring.  Laboratory evaluation will be sent to  evaluate for the above complaints.      Clinical Course as of 08/03/23 1535  Wed Aug 03, 2023  1347 Bedside ultrasound without any evidence of free fluid.  Will order formal ultrasound. [PR]  1406 Patient's POC pregnancy is negative.  Hemoglobin is 11.6.  She is hemodynamically stable. [PR]  1535 Patient was signed out to oncoming physician pending follow-up ultrasound. [PR]    Clinical Course User Index [PR] Willy Eddy, MD     FINAL CLINICAL IMPRESSION(S) / ED DIAGNOSES   Final diagnoses:  Vaginal bleeding     Rx / DC Orders   ED Discharge Orders     None        Note:  This document was prepared using Dragon voice recognition software and may include unintentional dictation errors.    Willy Eddy, MD 08/03/23 1535

## 2023-08-03 NOTE — ED Notes (Signed)
Pt to US.

## 2023-08-08 IMAGING — CT CT HEAD W/O CM
4 series · 16 of 47 positions shown, 18 images · non-contrast
Comparison: 05/17/2009

CLINICAL DATA: Sudden onset headache yesterday, sharp intermittent
pain radiating to top of head



[Series 2: head wo · axial · 0.43mm/px · z∈[+478,+583]mm · 7 of 29 slices shown, 9 images]
[im 4/29  brain]
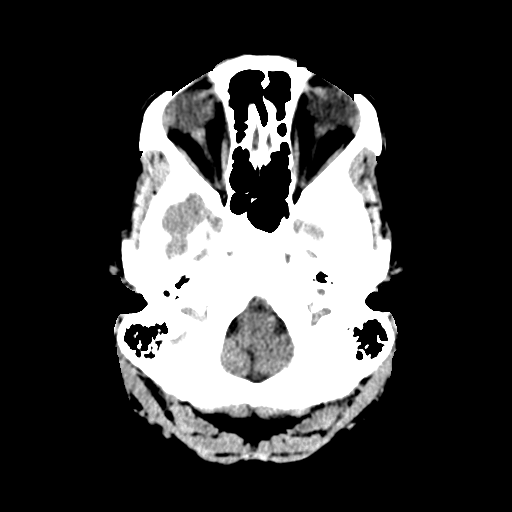
[im 4/29  bone]
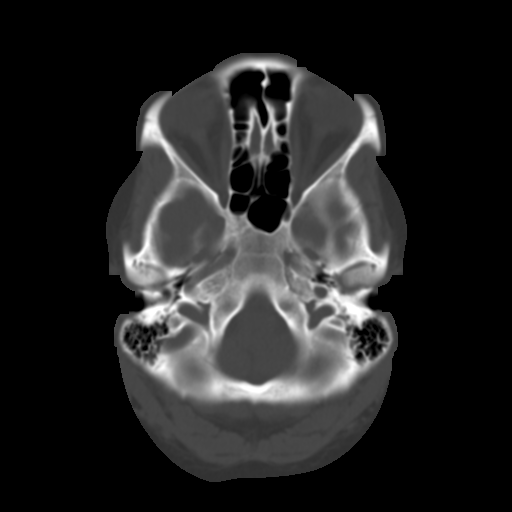
[im 8/29  brain]
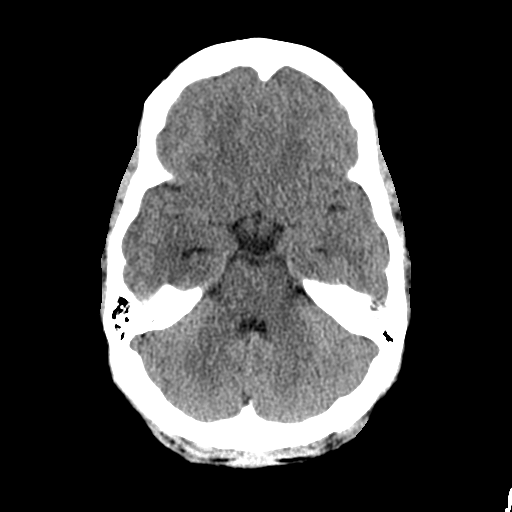
[im 11/29  brain]
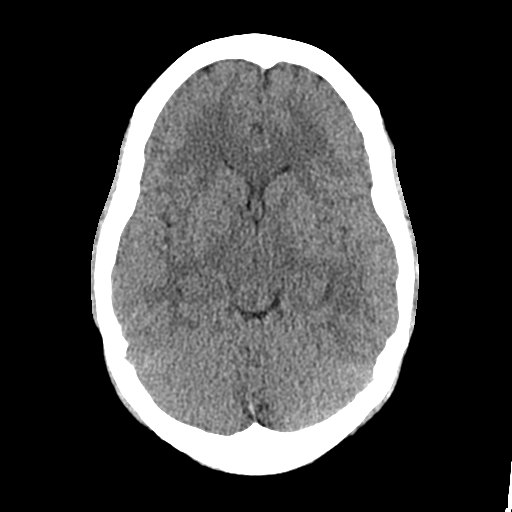
[im 15/29  brain]
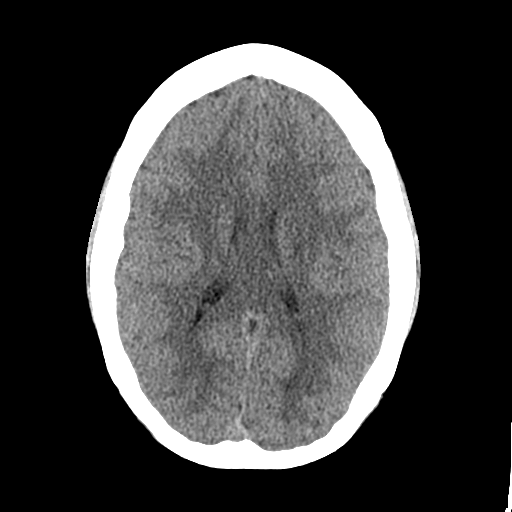
[im 18/29  brain]
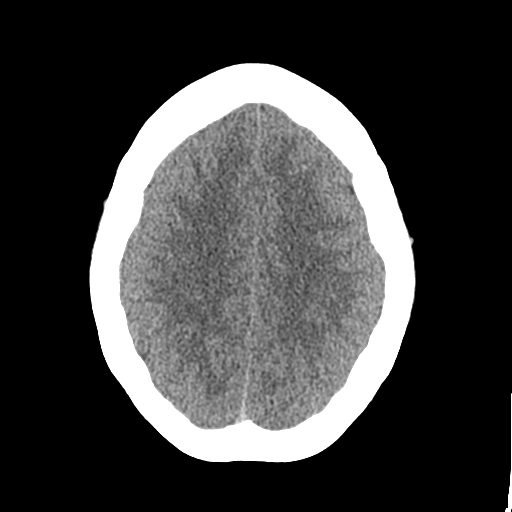
[im 18/29  bone]
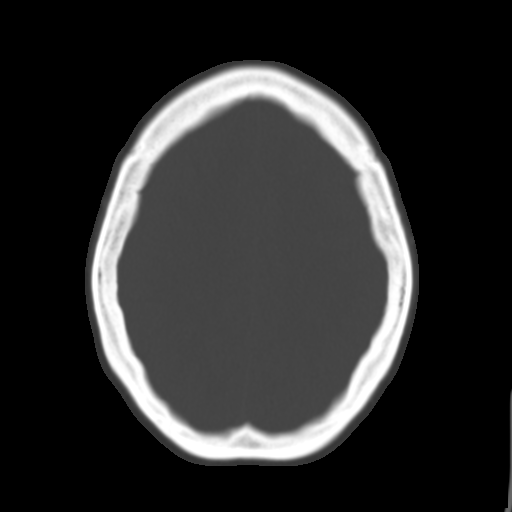
[im 22/29  brain]
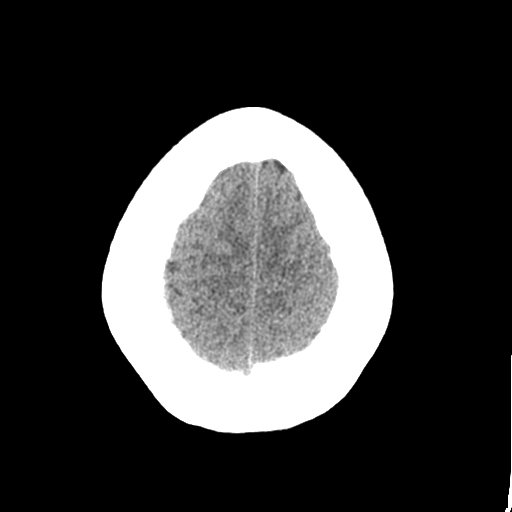
[im 25/29  brain]
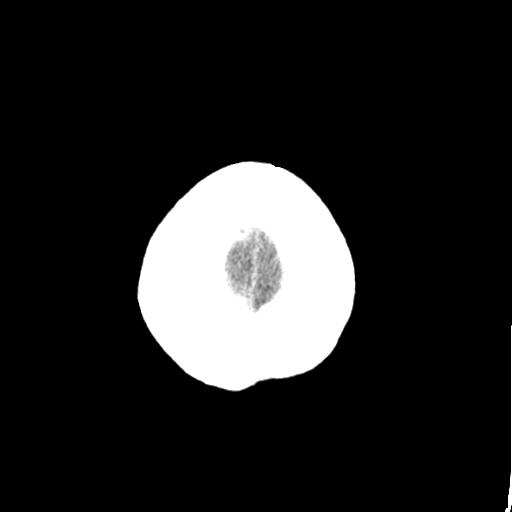

[Series 3: head bone · axial · 0.43mm/px · z∈[+477,+505]mm · 3 of 73 slices shown]
[im 8/73  bone]
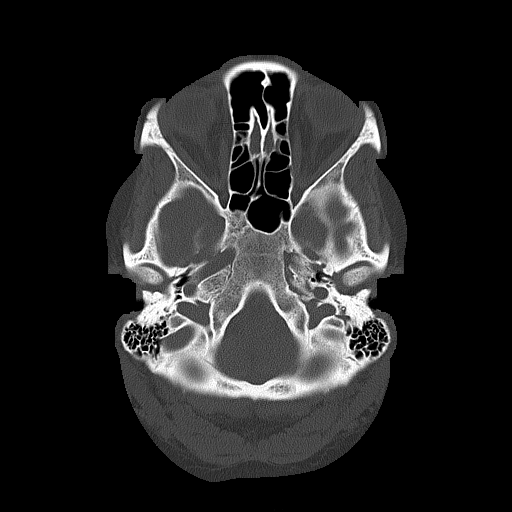
[im 15/73  bone]
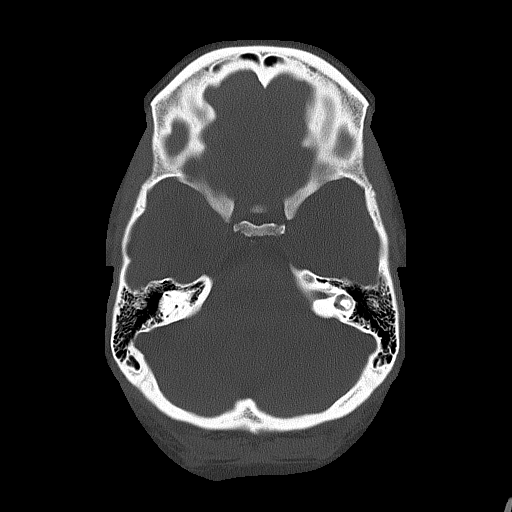
[im 22/73  bone]
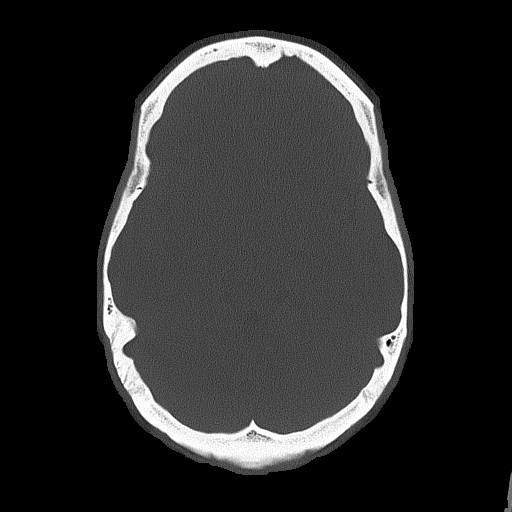

[Series 4: coronal soft tissue · coronal · 0.31mm/px · 3 of 67 slices shown]
[im 23/67  brain]
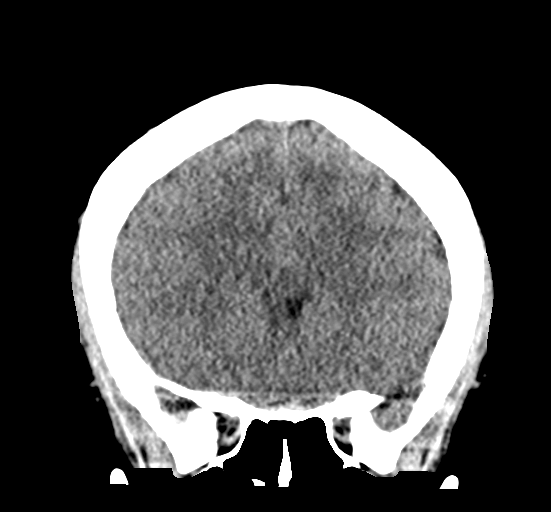
[im 30/67  brain]
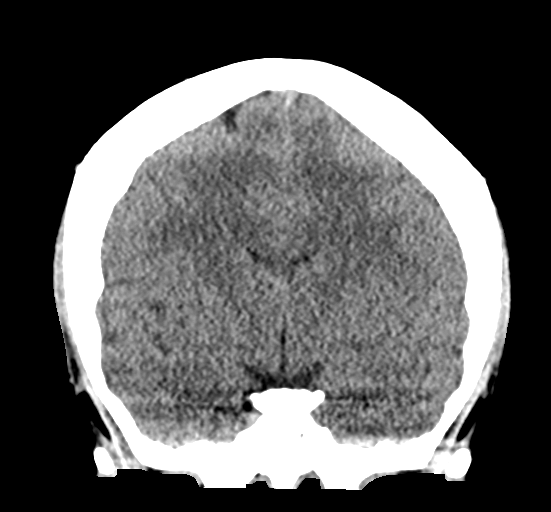
[im 37/67  brain]
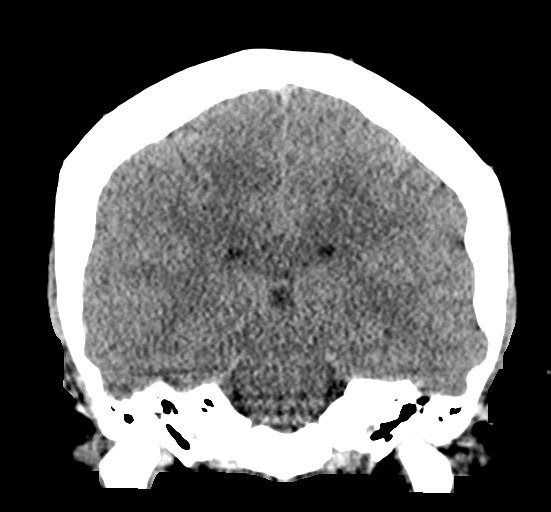

[Series 5: sagittal soft tissue · sagittal · 0.31mm/px · 3 of 54 slices shown]
[im 18/54  brain]
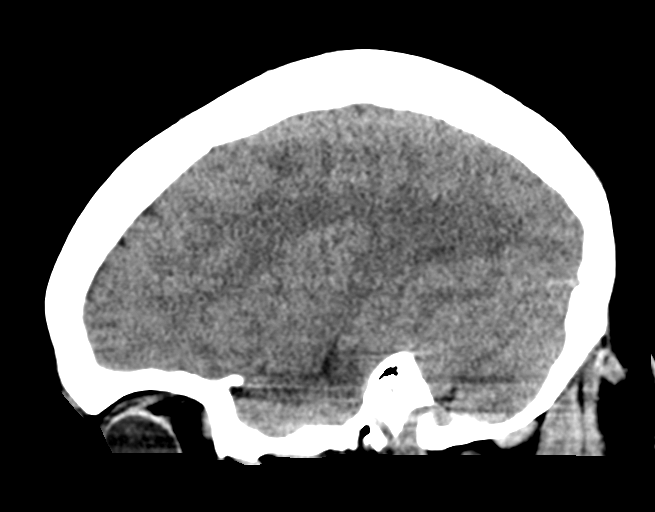
[im 27/54  brain]
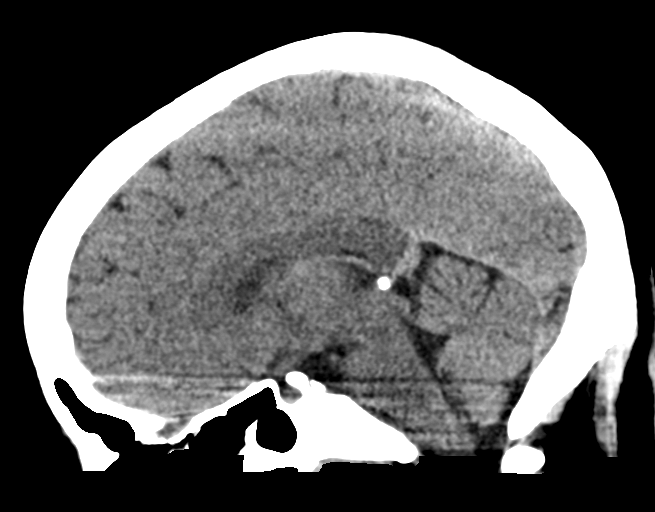
[im 36/54  brain]
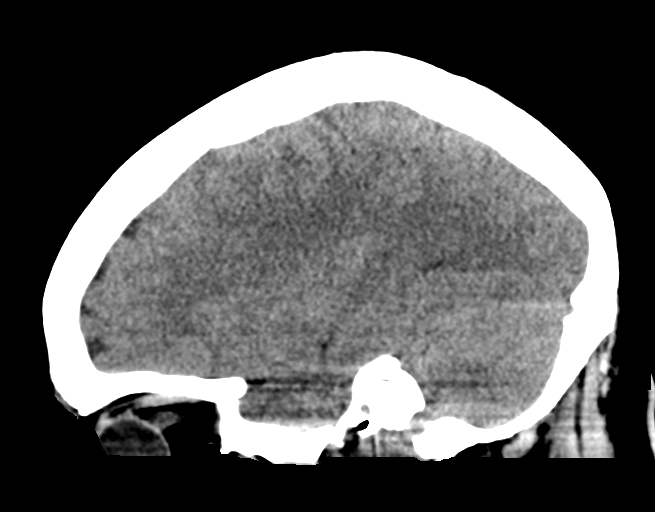

[16 of 47 positions shown; findings below may reference images not displayed]

FINDINGS: Brain: No acute infarct or hemorrhage. Lateral ventricles and
midline structures are unremarkable. No acute extra-axial fluid
collections. No mass effect.

Vascular: No hyperdense vessel or unexpected calcification.

Skull: Normal. Negative for fracture or focal lesion.

Sinuses/Orbits: No acute finding.

Other: None.
IMPRESSION: 1. No acute intracranial process.
